# Patient Record
Sex: Female | Born: 1955 | Race: White | Hispanic: No | Marital: Married | Smoking: Never smoker
Health system: Southern US, Community
[De-identification: ages and names within clinical notes are randomized; demographics above are authoritative.]

## PROBLEM LIST (undated history)

## (undated) DIAGNOSIS — I10 Essential (primary) hypertension: Secondary | ICD-10-CM

## (undated) DIAGNOSIS — R519 Headache, unspecified: Secondary | ICD-10-CM

## (undated) DIAGNOSIS — M1990 Unspecified osteoarthritis, unspecified site: Secondary | ICD-10-CM

## (undated) DIAGNOSIS — R51 Headache: Secondary | ICD-10-CM

## (undated) DIAGNOSIS — F419 Anxiety disorder, unspecified: Secondary | ICD-10-CM

## (undated) DIAGNOSIS — R319 Hematuria, unspecified: Secondary | ICD-10-CM

## (undated) DIAGNOSIS — Z91038 Other insect allergy status: Secondary | ICD-10-CM

## (undated) DIAGNOSIS — N390 Urinary tract infection, site not specified: Secondary | ICD-10-CM

## (undated) DIAGNOSIS — Z87440 Personal history of urinary (tract) infections: Secondary | ICD-10-CM

## (undated) HISTORY — DX: Other insect allergy status: Z91.038

## (undated) HISTORY — PX: COLONOSCOPY: SHX5424

---

## 1999-01-24 ENCOUNTER — Other Ambulatory Visit: Admission: RE | Admit: 1999-01-24 | Discharge: 1999-01-24 | Payer: Self-pay | Admitting: *Deleted

## 2000-03-02 ENCOUNTER — Other Ambulatory Visit: Admission: RE | Admit: 2000-03-02 | Discharge: 2000-03-02 | Payer: Self-pay | Admitting: *Deleted

## 2015-10-08 ENCOUNTER — Ambulatory Visit (INDEPENDENT_AMBULATORY_CARE_PROVIDER_SITE_OTHER): Payer: BC Managed Care – PPO

## 2015-10-08 ENCOUNTER — Ambulatory Visit (INDEPENDENT_AMBULATORY_CARE_PROVIDER_SITE_OTHER): Payer: BC Managed Care – PPO | Admitting: Sports Medicine

## 2015-10-08 VITALS — BP 148/88 | HR 84 | Resp 18 | Wt 193.5 lb

## 2015-10-08 DIAGNOSIS — M170 Bilateral primary osteoarthritis of knee: Secondary | ICD-10-CM

## 2015-10-08 DIAGNOSIS — M1611 Unilateral primary osteoarthritis, right hip: Secondary | ICD-10-CM

## 2015-10-08 DIAGNOSIS — M25561 Pain in right knee: Secondary | ICD-10-CM

## 2015-10-08 DIAGNOSIS — M25562 Pain in left knee: Secondary | ICD-10-CM | POA: Diagnosis not present

## 2015-10-08 DIAGNOSIS — M25551 Pain in right hip: Secondary | ICD-10-CM | POA: Diagnosis not present

## 2015-10-08 MED ORDER — MELOXICAM 15 MG PO TABS: ORAL_TABLET | ORAL | Status: DC

## 2015-10-08 NOTE — Assessment & Plan Note (Signed)
X-rays, meloxicam, return if no better in 2 weeks for injection

## 2015-10-08 NOTE — Progress Notes (Signed)
   Subjective:    I'm seeing this patient as a consultation for:   Dr. Feliciana Rossetti  CC:  Right hip and bilateral knee pain  HPI: This is a pleasant 60 year old female with a long history of bilateral knee pain along the medial joint line and under the kneecap, worse when going up and down stairs, with gelling in the morning, has tried a bit of ibuprofen with minimal response. Symptoms are moderate, persistent without radiation, no mechanical symptoms.  She also has right groin and buttock pain, worse with weightbearing, also with gelling.  Past medical history, Surgical history, Family history not pertinant except as noted below, Social history, Allergies, and medications have been entered into the medical record, reviewed, and no changes needed.   Review of Systems: No headache, visual changes, nausea, vomiting, diarrhea, constipation, dizziness, abdominal pain, skin rash, fevers, chills, night sweats, weight loss, swollen lymph nodes, body aches, joint swelling, muscle aches, chest pain, shortness of breath, mood changes, visual or auditory hallucinations.   Objective:   General: Well Developed, well nourished, and in no acute distress.  Neuro/Psych: Alert and oriented x3, extra-ocular muscles intact, able to move all 4 extremities, sensation grossly intact. Skin: Warm and dry, no rashes noted.  Respiratory: Not using accessory muscles, speaking in full sentences, trachea midline.  Cardiovascular: Pulses palpable, no extremity edema. Abdomen: Does not appear distended. Bilateral knees: Normal to inspection with no erythema or effusion or obvious bony abnormalities. Tender to palpation along the patellar facets and the medial joint lines of both knees ROM normal in flexion and extension and lower leg rotation. Ligaments with solid consistent endpoints including ACL, PCL, LCL, MCL. Negative Mcmurray's and provocative meniscal tests. Non painful patellar compression. Patellar and  quadriceps tendons unremarkable. Hamstring and quadriceps strength is normal. Right Hip: ROM IR: 60 Deg, ER: 60 Deg, Flexion: 120 Deg, Extension: 100 Deg, Abduction: 45 Deg, Adduction: 45 Deg, there is reproduction of pain with internal rotation. Strength IR: 5/5, ER: 5/5, Flexion: 5/5, Extension: 5/5, Abduction: 5/5, Adduction: 5/5 Pelvic alignment unremarkable to inspection and palpation. Standing hip rotation and gait without trendelenburg / unsteadiness. Greater trochanter without tenderness to palpation. No tenderness over piriformis. No SI joint tenderness and normal minimal SI movement.  Impression and Recommendations:   This case required medical decision making of moderate complexity.

## 2015-10-08 NOTE — Assessment & Plan Note (Signed)
X-rays, meloxicam. 

## 2016-11-07 IMAGING — CR DG KNEE COMPLETE 4+V*R*
5 series · 5 of 5 positions shown · non-contrast
Comparison: None.

CLINICAL DATA: Pain for several months, no known acute injury,
initial encounter

EXAM:
RIGHT KNEE - COMPLETE 4+ VIEW

[knee ap]
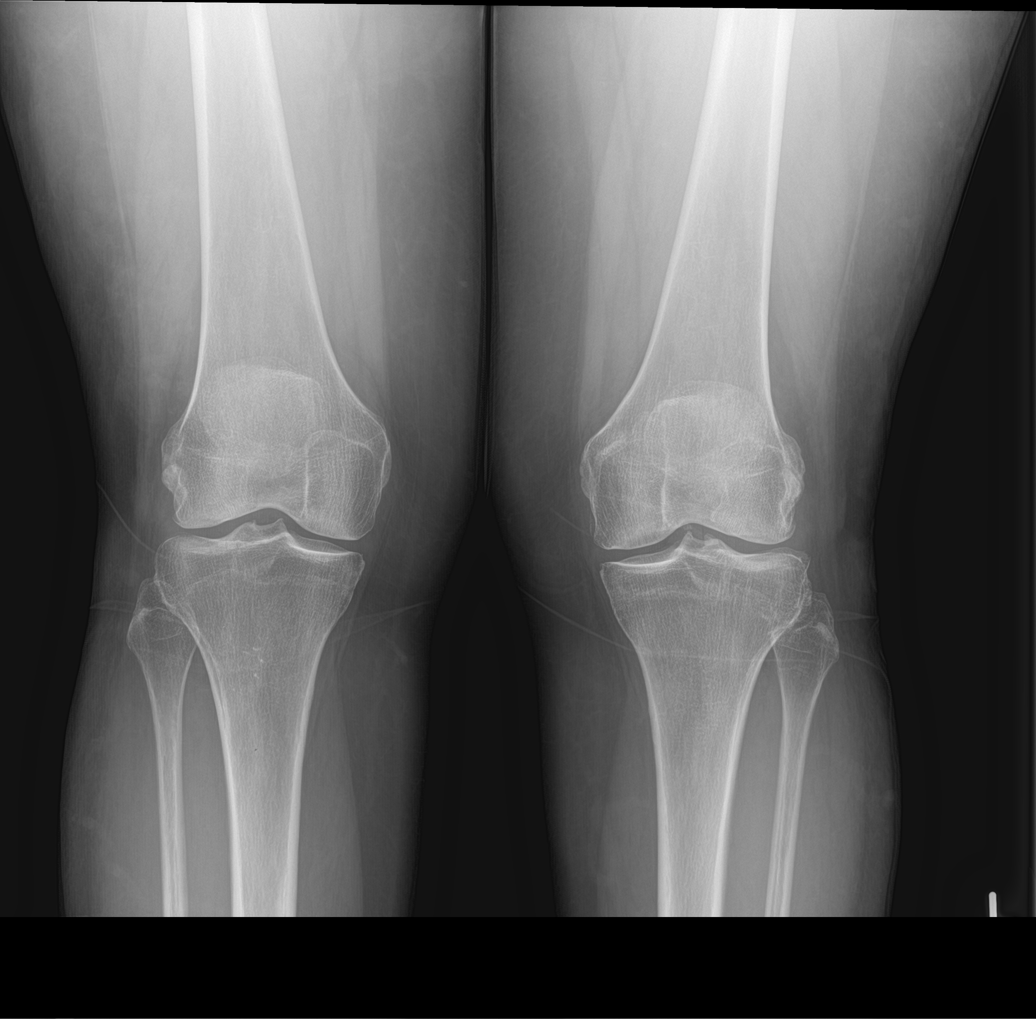

[tunnel]
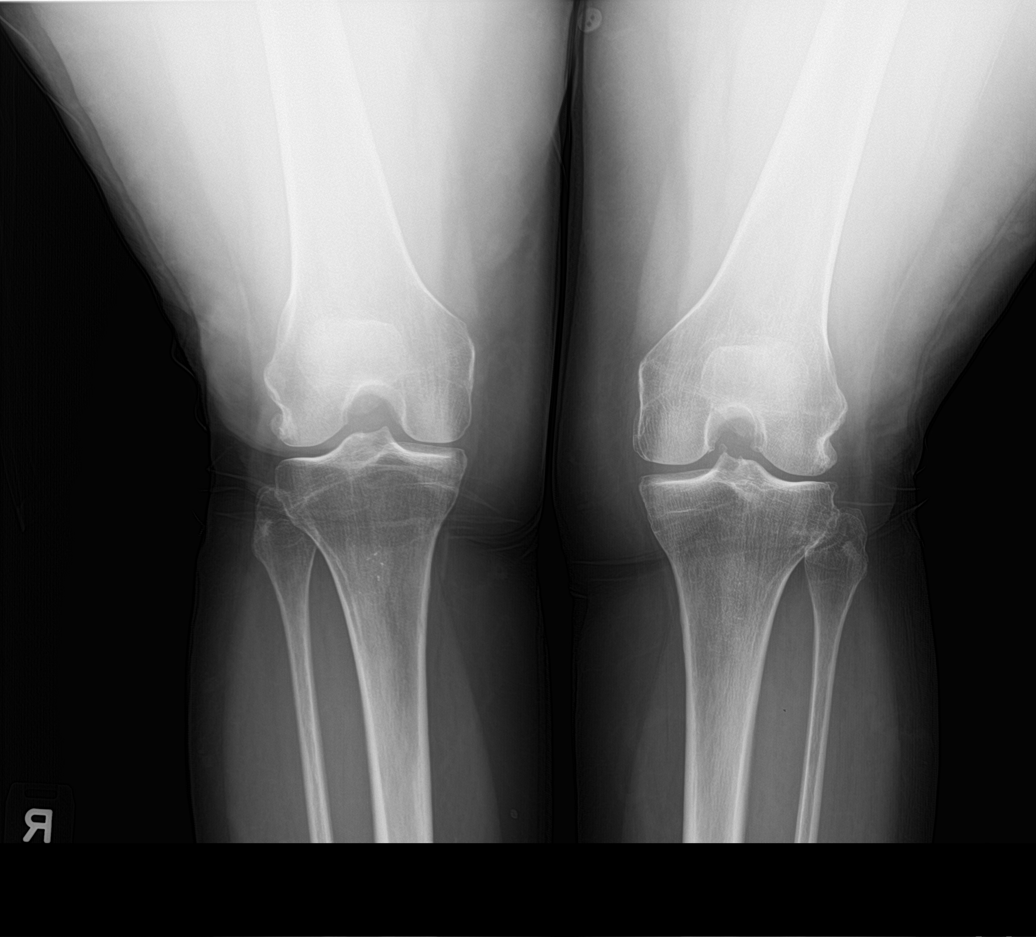

[knee lat (1 of 2)]
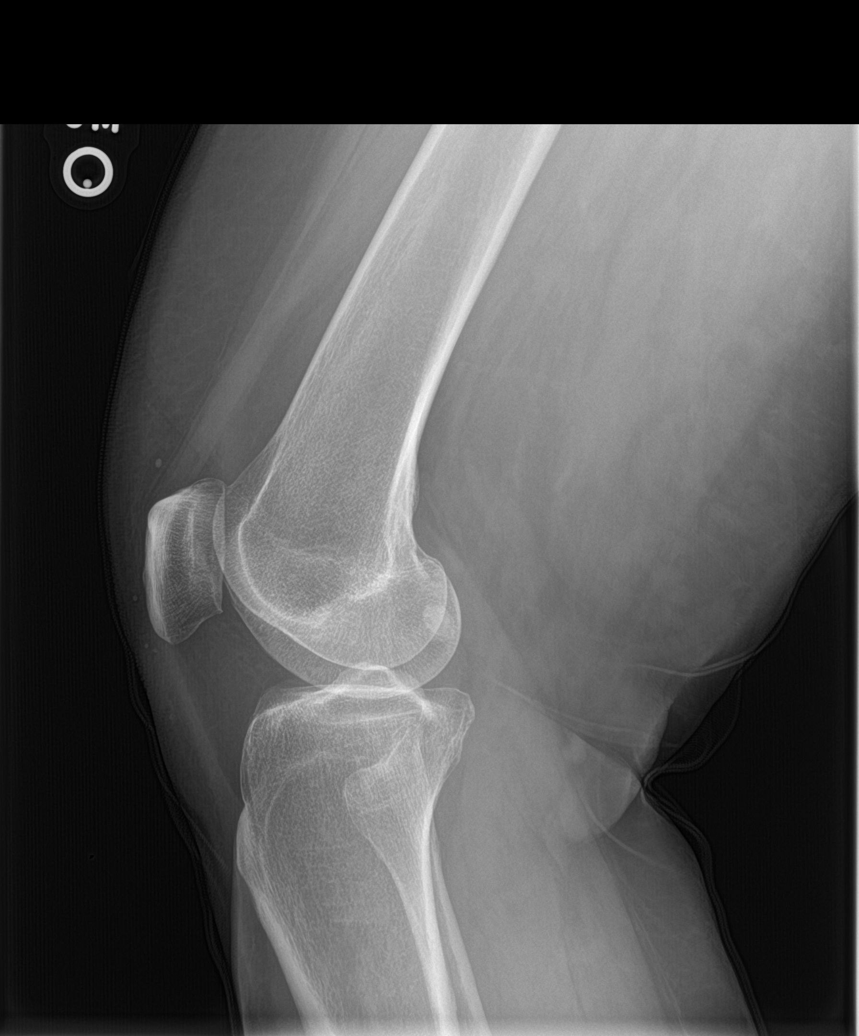

[knee sunrise]
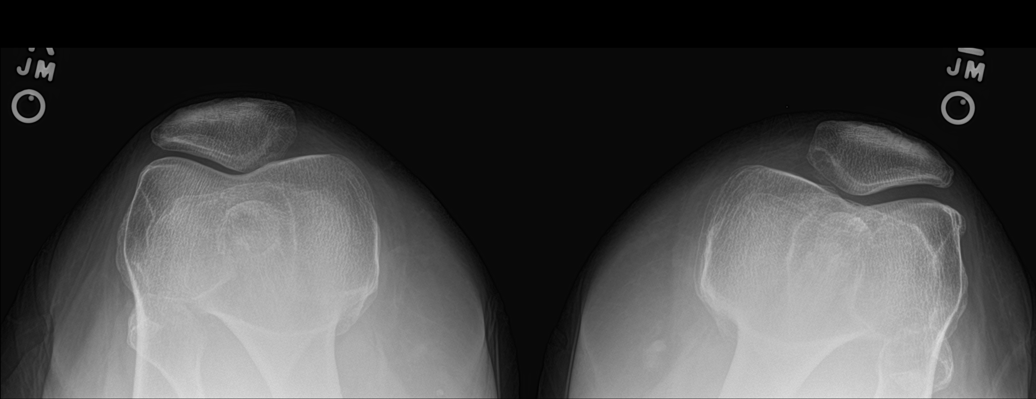

[knee lat (2 of 2)]
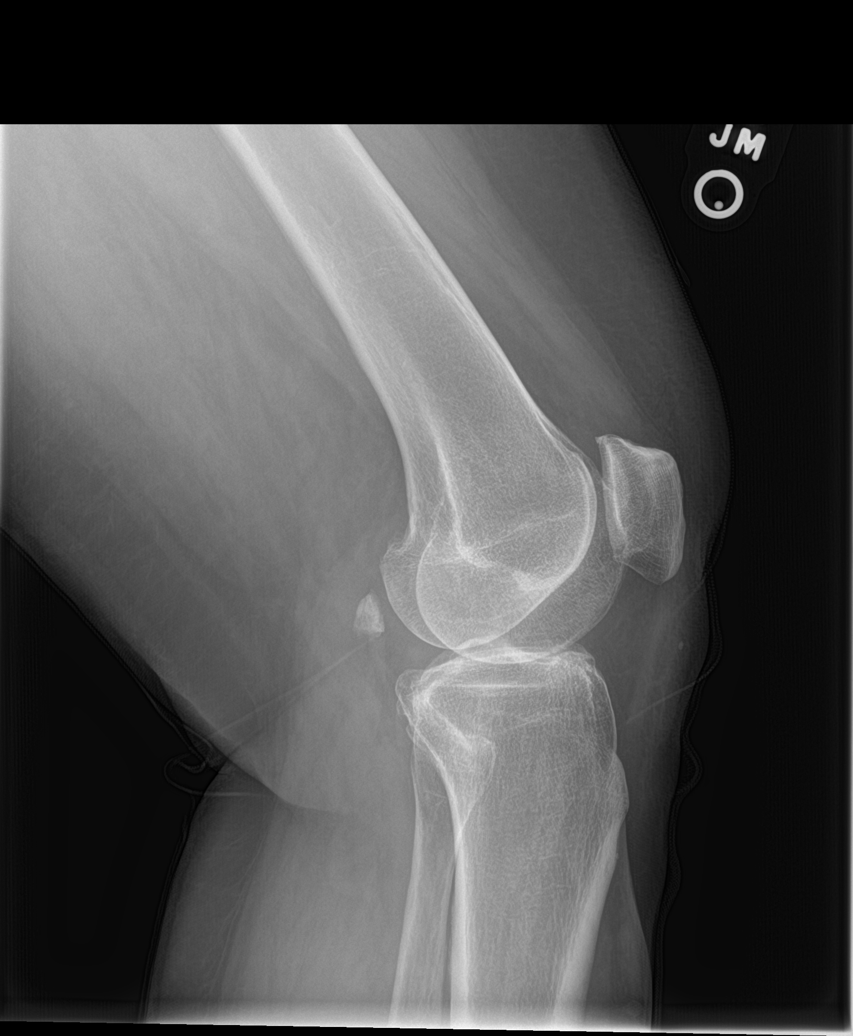

[5 of 5 positions shown; findings below may reference images not displayed]

FINDINGS: There is no evidence of fracture, dislocation, or joint effusion.
There is no evidence of arthropathy or other focal bone abnormality.
Soft tissues are unremarkable.
IMPRESSION: No acute abnormality noted.

## 2016-12-13 ENCOUNTER — Ambulatory Visit: Payer: Self-pay | Admitting: Orthopedic Surgery

## 2016-12-31 ENCOUNTER — Ambulatory Visit: Payer: Self-pay | Admitting: Orthopedic Surgery

## 2016-12-31 NOTE — H&P (Signed)
TOTAL HIP ADMISSION H&P  Patient is admitted for right total hip arthroplasty.  Subjective:  Chief Complaint: right hip pain  HPI: Gwendolyn Burnett, 61 y.o. female, has a history of pain and functional disability in the right hip(s) due to arthritis and patient has failed non-surgical conservative treatments for greater than 12 weeks to include NSAID's and/or analgesics, flexibility and strengthening excercises, use of assistive devices, weight reduction as appropriate and activity modification.  Onset of symptoms was gradual starting 5 years ago with rapidlly worsening course since that time.The patient noted no past surgery on the right hip(s).  Patient currently rates pain in the right hip at 10 out of 10 with activity. Patient has night pain, worsening of pain with activity and weight bearing, pain that interfers with activities of daily living, pain with passive range of motion and crepitus. Patient has evidence of subchondral cysts, subchondral sclerosis, periarticular osteophytes and joint space narrowing by imaging studies. This condition presents safety issues increasing the risk of falls. There is no current active infection.  Patient Active Problem List   Diagnosis Date Noted  . Primary osteoarthritis of right hip 10/08/2015  . Primary osteoarthritis of both knees 10/08/2015   No past medical history on file.  No past surgical history on file.   (Not in a hospital admission) Allergies  Allergen Reactions  . Sulfamethoxazole Itching    Sulfa Drugs    Social History  Substance Use Topics  . Smoking status: Not on file  . Smokeless tobacco: Not on file  . Alcohol use Not on file    No family history on file.   Review of Systems  Constitutional: Negative.   HENT: Negative.   Eyes: Negative.   Respiratory: Negative.   Cardiovascular: Negative.   Gastrointestinal: Negative.   Genitourinary: Negative.   Musculoskeletal: Positive for joint pain.  Skin: Negative.    Neurological: Negative.   Endo/Heme/Allergies: Negative.   Psychiatric/Behavioral: Negative.     Objective:  Physical Exam  Vitals reviewed. Constitutional: She is oriented to person, place, and time. She appears well-developed and well-nourished.  HENT:  Head: Normocephalic and atraumatic.  Eyes: Conjunctivae and EOM are normal. Pupils are equal, round, and reactive to light.  Neck: Normal range of motion. Neck supple.  Cardiovascular: Normal rate, regular rhythm and intact distal pulses.   Respiratory: Effort normal and breath sounds normal.  GI: Soft. She exhibits no distension.  Genitourinary:  Genitourinary Comments: deferred  Musculoskeletal:       Right hip: She exhibits decreased range of motion, decreased strength, tenderness and crepitus.  Neurological: She is alert and oriented to person, place, and time. She has normal reflexes.  Skin: Skin is warm and dry.  Psychiatric: She has a normal mood and affect. Her behavior is normal. Judgment and thought content normal.    Vital signs in last 24 hours: @VSRANGES@  Labs:   There is no height or weight on file to calculate BMI.   Imaging Review Plain radiographs demonstrate severe degenerative joint disease of the right hip(s). The bone quality appears to be adequate for age and reported activity level.  Assessment/Plan:  End stage arthritis, right hip(s)  The patient history, physical examination, clinical judgement of the provider and imaging studies are consistent with end stage degenerative joint disease of the right hip(s) and total hip arthroplasty is deemed medically necessary. The treatment options including medical management, injection therapy, arthroscopy and arthroplasty were discussed at length. The risks and benefits of total hip arthroplasty were   presented and reviewed. The risks due to aseptic loosening, infection, stiffness, dislocation/subluxation,  thromboembolic complications and other imponderables  were discussed.  The patient acknowledged the explanation, agreed to proceed with the plan and consent was signed. Patient is being admitted for inpatient treatment for surgery, pain control, PT, OT, prophylactic antibiotics, VTE prophylaxis, progressive ambulation and ADL's and discharge planning.The patient is planning to be discharged home with HEP. 

## 2017-01-15 ENCOUNTER — Encounter (HOSPITAL_COMMUNITY)
Admission: RE | Admit: 2017-01-15 | Discharge: 2017-01-15 | Disposition: A | Discharge status: Home / Self Care | Payer: BC Managed Care – PPO | Source: Ambulatory Visit | Attending: Orthopedic Surgery | Admitting: Orthopedic Surgery

## 2017-01-15 ENCOUNTER — Other Ambulatory Visit (HOSPITAL_COMMUNITY): Payer: Self-pay | Admitting: *Deleted

## 2017-01-15 ENCOUNTER — Encounter (HOSPITAL_COMMUNITY): Payer: Self-pay

## 2017-01-15 DIAGNOSIS — Z01812 Encounter for preprocedural laboratory examination: Secondary | ICD-10-CM | POA: Diagnosis present

## 2017-01-15 HISTORY — DX: Urinary tract infection, site not specified: N39.0

## 2017-01-15 HISTORY — DX: Unspecified osteoarthritis, unspecified site: M19.90

## 2017-01-15 HISTORY — DX: Anxiety disorder, unspecified: F41.9

## 2017-01-15 HISTORY — DX: Headache, unspecified: R51.9

## 2017-01-15 HISTORY — DX: Essential (primary) hypertension: I10

## 2017-01-15 HISTORY — DX: Headache: R51

## 2017-01-15 HISTORY — DX: Hematuria, unspecified: R31.9

## 2017-01-15 LAB — CBC
HCT: 41.3 % (ref 36.0–46.0)
Hemoglobin: 13.6 g/dL (ref 12.0–15.0)
MCH: 31.6 pg (ref 26.0–34.0)
MCHC: 32.9 g/dL (ref 30.0–36.0)
MCV: 95.8 fL (ref 78.0–100.0)
PLATELETS: 203 10*3/uL (ref 150–400)
RBC: 4.31 MIL/uL (ref 3.87–5.11)
RDW: 12.8 % (ref 11.5–15.5)
WBC: 5.9 10*3/uL (ref 4.0–10.5)

## 2017-01-15 LAB — TYPE AND SCREEN
ABO/RH(D): A POS
Antibody Screen: NEGATIVE

## 2017-01-15 LAB — BASIC METABOLIC PANEL
Anion gap: 7 (ref 5–15)
BUN: 13 mg/dL (ref 6–20)
CHLORIDE: 105 mmol/L (ref 101–111)
CO2: 29 mmol/L (ref 22–32)
CREATININE: 0.55 mg/dL (ref 0.44–1.00)
Calcium: 9.3 mg/dL (ref 8.9–10.3)
GFR calc non Af Amer: >60 mL/min (ref >60)
GLUCOSE: 98 mg/dL (ref 65–99)
Potassium: 3.4 mmol/L — ABNORMAL LOW (ref 3.5–5.1)
Sodium: 141 mmol/L (ref 135–145)

## 2017-01-15 LAB — SURGICAL PCR SCREEN
MRSA, PCR: NEGATIVE
Staphylococcus aureus: NEGATIVE

## 2017-01-15 LAB — ABO/RH: ABO/RH(D): A POS

## 2017-01-15 NOTE — Pre-Procedure Instructions (Signed)
Gwendolyn Burnett  01/15/2017      [facility] [place] [address] [place] [address] Phone: [phone] Fax: [phone]    Your procedure is scheduled on June 11  [facility] Ad[facility] at 0530 A.M.  Call this number if you have problems the morning of surgery:  [phone]   Remember:  Do not eat food or drink liquids after midnight.   Take these medicines the morning of surgery with A SIP OF WATER ALPRAZolam (XANAX), Naphazoline-Pheniramine (NAPHCON-A OP) if needed, SUMAtriptan (IMITREX), azelastine (ASTELIN) if needed  7 days prior to surgery STOP taking any meloxicam (MOBIC), Aspirin, Aleve, Naproxen, Ibuprofen, Motrin, Advil, Goody's, BC's, all herbal medications, fish oil, and all vitamins    Do not wear jewelry, make-up or nail polish.  Do not wear lotions, powders, or perfumes, or deoderant.  Do not shave 48 hours prior to surgery.    Do not bring valuables to the hospital.  [facility] is not responsible for any belongings or valuables.  Contacts, dentures or bridgework may not be worn into surgery.  Leave your suitcase in the car.  After surgery it may be brought to your room.  For patients admitted to the hospital, discharge time will be determined by your treatment team.  Patients discharged the day of surgery will not be allowed to drive home.    Special instructions:   [place]- Preparing For Surgery  Before surgery, you can play an important role. Because skin is not sterile, your skin needs to be as free of germs as possible. You can reduce the number of germs on your skin by washing with CHG (chlorahexidine gluconate) Soap before surgery.  CHG is an antiseptic cleaner which kills germs and bonds with the skin to continue killing germs even after washing.  Please do not use if you have an allergy to CHG or antibacterial soaps. If your skin becomes reddened/irritated stop using the CHG.  Do  not shave (including legs and underarms) for at least 48 hours prior to first CHG shower. It is OK to shave your face.  Please follow these instructions carefully.   1. Shower the NIGHT BEFORE SURGERY and the MORNING OF SURGERY with CHG.   2. If you chose to wash your hair, wash your hair first as usual with your normal shampoo.  3. After you shampoo, rinse your hair and body thoroughly to remove the shampoo.  4. Use CHG as you would any other liquid soap. You can apply CHG directly to the skin and wash gently with a scrungie or a clean washcloth.   5. Apply the CHG Soap to your body ONLY FROM THE NECK DOWN.  Do not use on open wounds or open sores. Avoid contact with your eyes, ears, mouth and genitals (private parts). Wash genitals (private parts) with your normal soap.  6. Wash thoroughly, paying special attention to the area where your surgery will be performed.  7. Thoroughly rinse your body with warm water from the neck down.  8. DO NOT shower/wash with your normal soap after using and rinsing off the CHG Soap.  9. Pat yourself dry with a CLEAN TOWEL.   10. Wear CLEAN PAJAMAS   11. Place CLEAN SHEETS on your bed the night of your first shower and DO NOT SLEEP WITH PETS.    Day of Surgery: Do not apply any deodorants/lotions. Please wear clean clothes to the  hospital/surgery center.      Please read over the following fact sheets that you were given.

## 2017-01-15 NOTE — Progress Notes (Signed)
PCP - Feliciana RossettiGreg Grisso Cardiologist - denies  Chest x-ray - not needed EKG - 12/09/16 requesting Stress Test - denies ECHO - denies Cardiac Cath - denies  Sending to anesthesia for review of requested EKG    Patient denies shortness of breath, fever, cough and chest pain at PAT appointment   Patient verbalized understanding of instructions that were given to them at the PAT appointment. Patient was also instructed that they will need to review over the PAT instructions again at home before surgery.

## 2017-01-22 NOTE — Progress Notes (Signed)
Called [phone] requested EKG tracing spoke with office staff states they will fax it over.

## 2017-01-23 MED ORDER — SODIUM CHLORIDE 0.9 % IV SOLN: INTRAVENOUS | Status: DC

## 2017-01-23 MED ORDER — ACETAMINOPHEN 10 MG/ML IV SOLN
1000.0000 mg | INTRAVENOUS | Status: AC
  Administered 2017-01-26: 1000 mg via INTRAVENOUS
  Filled 2017-01-23: qty 100

## 2017-01-23 MED ORDER — CEFAZOLIN SODIUM-DEXTROSE 2-4 GM/100ML-% IV SOLN
2.0000 g | INTRAVENOUS | Status: AC
Start: 2017-01-26 — End: 2017-01-26
  Administered 2017-01-26: 2 g via INTRAVENOUS
  Filled 2017-01-23: qty 100

## 2017-01-23 MED ORDER — TRANEXAMIC ACID 1000 MG/10ML IV SOLN
1000.0000 mg | INTRAVENOUS | Status: AC
  Administered 2017-01-26: 1000 mg via INTRAVENOUS
  Filled 2017-01-23: qty 10

## 2017-01-26 ENCOUNTER — Inpatient Hospital Stay (HOSPITAL_COMMUNITY)
Admission: RE | Admit: 2017-01-26 | Discharge: 2017-01-27 | DRG: 470 | Disposition: A | Discharge status: Home / Self Care | Payer: BC Managed Care – PPO | Source: Ambulatory Visit | Attending: Orthopedic Surgery | Admitting: Orthopedic Surgery

## 2017-01-26 ENCOUNTER — Encounter (HOSPITAL_COMMUNITY)
Admission: RE | Disposition: A | Discharge status: Home / Self Care | Payer: Self-pay | Source: Ambulatory Visit | Attending: Orthopedic Surgery

## 2017-01-26 ENCOUNTER — Inpatient Hospital Stay (HOSPITAL_COMMUNITY): Payer: BC Managed Care – PPO | Admitting: Emergency Medicine

## 2017-01-26 ENCOUNTER — Inpatient Hospital Stay (HOSPITAL_COMMUNITY): Payer: BC Managed Care – PPO

## 2017-01-26 ENCOUNTER — Inpatient Hospital Stay (HOSPITAL_COMMUNITY): Payer: BC Managed Care – PPO | Admitting: Anesthesiology

## 2017-01-26 ENCOUNTER — Encounter (HOSPITAL_COMMUNITY): Payer: Self-pay | Admitting: General Practice

## 2017-01-26 DIAGNOSIS — Z419 Encounter for procedure for purposes other than remedying health state, unspecified: Secondary | ICD-10-CM

## 2017-01-26 DIAGNOSIS — Z79899 Other long term (current) drug therapy: Secondary | ICD-10-CM

## 2017-01-26 DIAGNOSIS — I10 Essential (primary) hypertension: Secondary | ICD-10-CM | POA: Diagnosis present

## 2017-01-26 DIAGNOSIS — M25551 Pain in right hip: Secondary | ICD-10-CM | POA: Diagnosis present

## 2017-01-26 DIAGNOSIS — M1611 Unilateral primary osteoarthritis, right hip: Secondary | ICD-10-CM | POA: Diagnosis present

## 2017-01-26 DIAGNOSIS — Z09 Encounter for follow-up examination after completed treatment for conditions other than malignant neoplasm: Secondary | ICD-10-CM

## 2017-01-26 DIAGNOSIS — F419 Anxiety disorder, unspecified: Secondary | ICD-10-CM | POA: Diagnosis present

## 2017-01-26 HISTORY — PX: TOTAL HIP ARTHROPLASTY: SHX124

## 2017-01-26 HISTORY — DX: Personal history of urinary (tract) infections: Z87.440

## 2017-01-26 SURGERY — ARTHROPLASTY, HIP, TOTAL, ANTERIOR APPROACH
Anesthesia: General | Site: Hip | Laterality: Right

## 2017-01-26 MED ORDER — MIDAZOLAM HCL 5 MG/5ML IJ SOLN
INTRAMUSCULAR | Status: DC | PRN
  Administered 2017-01-26 (×2): 2 mg via INTRAVENOUS

## 2017-01-26 MED ORDER — TRANEXAMIC ACID 1000 MG/10ML IV SOLN
1000.0000 mg | Freq: Once | INTRAVENOUS | Status: AC
  Administered 2017-01-26: 1000 mg via INTRAVENOUS
  Filled 2017-01-26: qty 10

## 2017-01-26 MED ORDER — IRBESARTAN 150 MG PO TABS
75.0000 mg | ORAL_TABLET | Freq: Every day | ORAL | Status: DC
  Administered 2017-01-26 – 2017-01-27 (×2): 75 mg via ORAL
  Filled 2017-01-26 (×2): qty 1

## 2017-01-26 MED ORDER — METHOCARBAMOL 1000 MG/10ML IJ SOLN: 500.0000 mg | Freq: Four times a day (QID) | INTRAVENOUS | Status: DC | PRN

## 2017-01-26 MED ORDER — CEFAZOLIN SODIUM-DEXTROSE 2-4 GM/100ML-% IV SOLN
2.0000 g | Freq: Four times a day (QID) | INTRAVENOUS | Status: AC
  Administered 2017-01-26 (×2): 2 g via INTRAVENOUS
  Filled 2017-01-26 (×2): qty 100

## 2017-01-26 MED ORDER — ONDANSETRON HCL 4 MG/2ML IJ SOLN: 4.0000 mg | Freq: Four times a day (QID) | INTRAMUSCULAR | Status: DC | PRN

## 2017-01-26 MED ORDER — PHENYLEPHRINE 40 MCG/ML (10ML) SYRINGE FOR IV PUSH (FOR BLOOD PRESSURE SUPPORT)
PREFILLED_SYRINGE | INTRAVENOUS | Status: DC | PRN
Start: 2017-01-26 — End: 2017-01-26
  Administered 2017-01-26 (×2): 80 ug via INTRAVENOUS

## 2017-01-26 MED ORDER — PHENYLEPHRINE 40 MCG/ML (10ML) SYRINGE FOR IV PUSH (FOR BLOOD PRESSURE SUPPORT)
PREFILLED_SYRINGE | INTRAVENOUS | Status: AC
  Filled 2017-01-26: qty 10

## 2017-01-26 MED ORDER — FENTANYL CITRATE (PF) 250 MCG/5ML IJ SOLN
INTRAMUSCULAR | Status: DC | PRN
  Administered 2017-01-26 (×2): 50 ug via INTRAVENOUS

## 2017-01-26 MED ORDER — PHENYLEPHRINE HCL 10 MG/ML IJ SOLN
INTRAMUSCULAR | Status: DC | PRN
  Administered 2017-01-26: 25 ug/min via INTRAVENOUS

## 2017-01-26 MED ORDER — PROPOFOL 10 MG/ML IV BOLUS
INTRAVENOUS | Status: DC | PRN
  Administered 2017-01-26: 30 mg via INTRAVENOUS

## 2017-01-26 MED ORDER — BUPIVACAINE HCL (PF) 0.75 % IJ SOLN
INTRAMUSCULAR | Status: DC | PRN
  Administered 2017-01-26: 1.8 mL via INTRATHECAL

## 2017-01-26 MED ORDER — PROPOFOL 10 MG/ML IV BOLUS
INTRAVENOUS | Status: AC
  Filled 2017-01-26: qty 20

## 2017-01-26 MED ORDER — BUPIVACAINE-EPINEPHRINE (PF) 0.5% -1:200000 IJ SOLN
INTRAMUSCULAR | Status: AC
  Filled 2017-01-26: qty 30

## 2017-01-26 MED ORDER — HYDROMORPHONE HCL 1 MG/ML IJ SOLN: 0.2500 mg | INTRAMUSCULAR | Status: DC | PRN

## 2017-01-26 MED ORDER — CHLORHEXIDINE GLUCONATE 4 % EX LIQD: 60.0000 mL | Freq: Once | CUTANEOUS | Status: DC

## 2017-01-26 MED ORDER — PROPOFOL 500 MG/50ML IV EMUL
INTRAVENOUS | Status: DC | PRN
  Administered 2017-01-26: 75 ug/kg/min via INTRAVENOUS

## 2017-01-26 MED ORDER — VITAMIN C 500 MG PO TABS
1000.0000 mg | ORAL_TABLET | Freq: Every day | ORAL | Status: DC
  Administered 2017-01-27: 1000 mg via ORAL
  Filled 2017-01-26: qty 2

## 2017-01-26 MED ORDER — ACETAMINOPHEN 325 MG PO TABS: 650.0000 mg | ORAL_TABLET | Freq: Four times a day (QID) | ORAL | Status: DC | PRN

## 2017-01-26 MED ORDER — FENTANYL CITRATE (PF) 250 MCG/5ML IJ SOLN
INTRAMUSCULAR | Status: AC
  Filled 2017-01-26: qty 5

## 2017-01-26 MED ORDER — ASPIRIN 81 MG PO CHEW
81.0000 mg | CHEWABLE_TABLET | Freq: Two times a day (BID) | ORAL | Status: DC
  Administered 2017-01-26 – 2017-01-27 (×2): 81 mg via ORAL
  Filled 2017-01-26 (×2): qty 1

## 2017-01-26 MED ORDER — KETOROLAC TROMETHAMINE 15 MG/ML IJ SOLN
15.0000 mg | Freq: Four times a day (QID) | INTRAMUSCULAR | Status: AC
  Administered 2017-01-26 – 2017-01-27 (×4): 15 mg via INTRAVENOUS
  Filled 2017-01-26 (×4): qty 1

## 2017-01-26 MED ORDER — MIDAZOLAM HCL 2 MG/2ML IJ SOLN
INTRAMUSCULAR | Status: AC
  Filled 2017-01-26: qty 2

## 2017-01-26 MED ORDER — 0.9 % SODIUM CHLORIDE (POUR BTL) OPTIME
TOPICAL | Status: DC | PRN
  Administered 2017-01-26: 1000 mL

## 2017-01-26 MED ORDER — LORATADINE 10 MG PO TABS
10.0000 mg | ORAL_TABLET | Freq: Every day | ORAL | Status: DC
  Filled 2017-01-26: qty 1

## 2017-01-26 MED ORDER — MENTHOL 3 MG MT LOZG: 1.0000 | LOZENGE | OROMUCOSAL | Status: DC | PRN

## 2017-01-26 MED ORDER — POLYETHYLENE GLYCOL 3350 17 G PO PACK: 17.0000 g | PACK | Freq: Every day | ORAL | Status: DC | PRN

## 2017-01-26 MED ORDER — DOCUSATE SODIUM 100 MG PO CAPS
100.0000 mg | ORAL_CAPSULE | Freq: Two times a day (BID) | ORAL | Status: DC
  Administered 2017-01-26 – 2017-01-27 (×2): 100 mg via ORAL
  Filled 2017-01-26 (×2): qty 1

## 2017-01-26 MED ORDER — SODIUM CHLORIDE 0.9 % IV SOLN
INTRAVENOUS | Status: DC
  Administered 2017-01-27: 02:00:00 via INTRAVENOUS

## 2017-01-26 MED ORDER — KETOROLAC TROMETHAMINE 30 MG/ML IJ SOLN
INTRAMUSCULAR | Status: AC
  Filled 2017-01-26: qty 1

## 2017-01-26 MED ORDER — LACTATED RINGERS IV SOLN
INTRAVENOUS | Status: DC | PRN
  Administered 2017-01-26 (×3): via INTRAVENOUS

## 2017-01-26 MED ORDER — ONDANSETRON HCL 4 MG/2ML IJ SOLN
INTRAMUSCULAR | Status: DC | PRN
  Administered 2017-01-26: 4 mg via INTRAVENOUS

## 2017-01-26 MED ORDER — SODIUM CHLORIDE 0.9 % IR SOLN
Status: DC | PRN
  Administered 2017-01-26: 3000 mL

## 2017-01-26 MED ORDER — LIDOCAINE 2% (20 MG/ML) 5 ML SYRINGE
INTRAMUSCULAR | Status: AC
  Filled 2017-01-26: qty 5

## 2017-01-26 MED ORDER — ALPRAZOLAM 0.25 MG PO TABS: 0.2500 mg | ORAL_TABLET | Freq: Two times a day (BID) | ORAL | Status: DC | PRN

## 2017-01-26 MED ORDER — ONDANSETRON HCL 4 MG PO TABS: 4.0000 mg | ORAL_TABLET | Freq: Four times a day (QID) | ORAL | Status: DC | PRN

## 2017-01-26 MED ORDER — HYDROMORPHONE HCL 1 MG/ML IJ SOLN: 0.5000 mg | INTRAMUSCULAR | Status: DC | PRN

## 2017-01-26 MED ORDER — DIPHENHYDRAMINE HCL 12.5 MG/5ML PO ELIX: 12.5000 mg | ORAL_SOLUTION | ORAL | Status: DC | PRN

## 2017-01-26 MED ORDER — NAPHAZOLINE-PHENIRAMINE 0.025-0.3 % OP SOLN: 1.0000 [drp] | Freq: Two times a day (BID) | OPHTHALMIC | Status: DC | PRN

## 2017-01-26 MED ORDER — DEXAMETHASONE SODIUM PHOSPHATE 10 MG/ML IJ SOLN
10.0000 mg | Freq: Once | INTRAMUSCULAR | Status: AC
  Administered 2017-01-27: 10 mg via INTRAVENOUS
  Filled 2017-01-26: qty 1

## 2017-01-26 MED ORDER — PROMETHAZINE HCL 25 MG/ML IJ SOLN
6.2500 mg | INTRAMUSCULAR | Status: DC | PRN
Start: 2017-01-26 — End: 2017-01-26

## 2017-01-26 MED ORDER — KETOROLAC TROMETHAMINE 30 MG/ML IJ SOLN
INTRAMUSCULAR | Status: DC | PRN
  Administered 2017-01-26: 30 mg

## 2017-01-26 MED ORDER — METOCLOPRAMIDE HCL 5 MG/ML IJ SOLN: 5.0000 mg | Freq: Three times a day (TID) | INTRAMUSCULAR | Status: DC | PRN

## 2017-01-26 MED ORDER — ONDANSETRON HCL 4 MG/2ML IJ SOLN
INTRAMUSCULAR | Status: AC
  Filled 2017-01-26: qty 2

## 2017-01-26 MED ORDER — ROCURONIUM BROMIDE 10 MG/ML (PF) SYRINGE
PREFILLED_SYRINGE | INTRAVENOUS | Status: AC
  Filled 2017-01-26: qty 5

## 2017-01-26 MED ORDER — SENNA 8.6 MG PO TABS: 2.0000 | ORAL_TABLET | Freq: Every day | ORAL | Status: DC

## 2017-01-26 MED ORDER — ALUM & MAG HYDROXIDE-SIMETH 200-200-20 MG/5ML PO SUSP
30.0000 mL | ORAL | Status: DC | PRN
  Administered 2017-01-26: 30 mL via ORAL
  Filled 2017-01-26: qty 30

## 2017-01-26 MED ORDER — METHOCARBAMOL 500 MG PO TABS: 500.0000 mg | ORAL_TABLET | Freq: Four times a day (QID) | ORAL | Status: DC | PRN

## 2017-01-26 MED ORDER — SODIUM CHLORIDE 0.9 % IJ SOLN
INTRAMUSCULAR | Status: DC | PRN
  Administered 2017-01-26: 30 mL

## 2017-01-26 MED ORDER — ACETAMINOPHEN 650 MG RE SUPP: 650.0000 mg | Freq: Four times a day (QID) | RECTAL | Status: DC | PRN

## 2017-01-26 MED ORDER — METOCLOPRAMIDE HCL 5 MG PO TABS: 5.0000 mg | ORAL_TABLET | Freq: Three times a day (TID) | ORAL | Status: DC | PRN

## 2017-01-26 MED ORDER — POVIDONE-IODINE 10 % EX SWAB: 2.0000 "application " | Freq: Once | CUTANEOUS | Status: DC

## 2017-01-26 MED ORDER — PHENOL 1.4 % MT LIQD: 1.0000 | OROMUCOSAL | Status: DC | PRN

## 2017-01-26 MED ORDER — PROPOFOL 1000 MG/100ML IV EMUL
INTRAVENOUS | Status: AC
  Filled 2017-01-26: qty 300

## 2017-01-26 MED ORDER — HYDROCODONE-ACETAMINOPHEN 5-325 MG PO TABS: 1.0000 | ORAL_TABLET | ORAL | Status: DC | PRN

## 2017-01-26 SURGICAL SUPPLY — 55 items
ADH SKN CLS APL DERMABOND .7 (GAUZE/BANDAGES/DRESSINGS) ×2
ADH SKN CLS LQ APL DERMABOND (GAUZE/BANDAGES/DRESSINGS) ×1
ALCOHOL ISOPROPYL (RUBBING) (MISCELLANEOUS) ×3 IMPLANT
BLADE CLIPPER SURG (BLADE) IMPLANT
CAPT HIP TOTAL 2 ×3 IMPLANT
CHLORAPREP W/TINT 26ML (MISCELLANEOUS) ×3 IMPLANT
COVER SURGICAL LIGHT HANDLE (MISCELLANEOUS) ×3 IMPLANT
DERMABOND ADHESIVE PROPEN (GAUZE/BANDAGES/DRESSINGS) ×2
DERMABOND ADVANCED (GAUZE/BANDAGES/DRESSINGS) ×4
DERMABOND ADVANCED .7 DNX12 (GAUZE/BANDAGES/DRESSINGS) ×2 IMPLANT
DERMABOND ADVANCED .7 DNX6 (GAUZE/BANDAGES/DRESSINGS) ×1 IMPLANT
DRAPE C-ARM 42X72 X-RAY (DRAPES) ×3 IMPLANT
DRAPE STERI IOBAN 125X83 (DRAPES) ×3 IMPLANT
DRAPE U-SHAPE 47X51 STRL (DRAPES) ×9 IMPLANT
DRSG AQUACEL AG ADV 3.5X10 (GAUZE/BANDAGES/DRESSINGS) ×3 IMPLANT
ELECT BLADE 4.0 EZ CLEAN MEGAD (MISCELLANEOUS) ×3
ELECT REM PT RETURN 9FT ADLT (ELECTROSURGICAL) ×3
ELECTRODE BLDE 4.0 EZ CLN MEGD (MISCELLANEOUS) ×1 IMPLANT
ELECTRODE REM PT RTRN 9FT ADLT (ELECTROSURGICAL) ×1 IMPLANT
EVACUATOR 1/8 PVC DRAIN (DRAIN) IMPLANT
GLOVE BIO SURGEON STRL SZ8.5 (GLOVE) ×6 IMPLANT
GLOVE BIOGEL PI IND STRL 8.5 (GLOVE) ×1 IMPLANT
GLOVE BIOGEL PI INDICATOR 8.5 (GLOVE) ×2
GOWN STRL REUS W/ TWL LRG LVL3 (GOWN DISPOSABLE) ×2 IMPLANT
GOWN STRL REUS W/TWL 2XL LVL3 (GOWN DISPOSABLE) ×3 IMPLANT
GOWN STRL REUS W/TWL LRG LVL3 (GOWN DISPOSABLE) ×6
HANDPIECE INTERPULSE COAX TIP (DISPOSABLE) ×3
HOOD PEEL AWAY FACE SHEILD DIS (HOOD) ×6 IMPLANT
KIT BASIN OR (CUSTOM PROCEDURE TRAY) ×3 IMPLANT
KIT ROOM TURNOVER OR (KITS) ×3 IMPLANT
MANIFOLD NEPTUNE II (INSTRUMENTS) ×3 IMPLANT
MARKER SKIN DUAL TIP RULER LAB (MISCELLANEOUS) ×6 IMPLANT
NEEDLE SPNL 18GX3.5 QUINCKE PK (NEEDLE) ×3 IMPLANT
NS IRRIG 1000ML POUR BTL (IV SOLUTION) ×3 IMPLANT
PACK TOTAL JOINT (CUSTOM PROCEDURE TRAY) ×3 IMPLANT
PACK UNIVERSAL I (CUSTOM PROCEDURE TRAY) ×3 IMPLANT
PAD ARMBOARD 7.5X6 YLW CONV (MISCELLANEOUS) ×6 IMPLANT
SAW OSC TIP CART 19.5X105X1.3 (SAW) ×3 IMPLANT
SEALER BIPOLAR AQUA 6.0 (INSTRUMENTS) IMPLANT
SET HNDPC FAN SPRY TIP SCT (DISPOSABLE) ×1 IMPLANT
SOL PREP POV-IOD 4OZ 10% (MISCELLANEOUS) ×3 IMPLANT
SUT ETHIBOND NAB CT1 #1 30IN (SUTURE) ×6 IMPLANT
SUT MNCRL AB 3-0 PS2 18 (SUTURE) ×3 IMPLANT
SUT MON AB 2-0 CT1 36 (SUTURE) ×3 IMPLANT
SUT VIC AB 1 CT1 27 (SUTURE) ×2
SUT VIC AB 1 CT1 27XBRD ANBCTR (SUTURE) ×1 IMPLANT
SUT VIC AB 2-0 CT1 27 (SUTURE) ×3
SUT VIC AB 2-0 CT1 TAPERPNT 27 (SUTURE) ×1 IMPLANT
SUT VLOC 180 0 24IN GS25 (SUTURE) ×3 IMPLANT
SYR 50ML LL SCALE MARK (SYRINGE) ×3 IMPLANT
TOWEL OR 17X24 6PK STRL BLUE (TOWEL DISPOSABLE) ×3 IMPLANT
TOWEL OR 17X26 10 PK STRL BLUE (TOWEL DISPOSABLE) ×3 IMPLANT
TRAY CATH 16FR W/PLASTIC CATH (SET/KITS/TRAYS/PACK) IMPLANT
TRAY FOLEY CATH SILVER 16FR (SET/KITS/TRAYS/PACK) IMPLANT
WATER STERILE IRR 1000ML POUR (IV SOLUTION) ×9 IMPLANT

## 2017-01-26 NOTE — Evaluation (Signed)
Physical Therapy Evaluation Patient Details Name: Gwendolyn Burnett MRN: [id number] DOB: Aug 08, 1956 Today's Date: 01/26/2017   History of Present Illness  Pt is a 61 y/o female s/p elective R THA; direct anterior approproache. PMH includes HTN.   Clinical Impression  Pt s/p surgery above with deficits below. PTA, pt was using cane for ambulation secondary to pain in R hip. Upon evaluation, pt limited by post op pain and weakness, along with mild unsteadiness. Pt requiring min guard to min A for functional mobility tasks. Pt reports husband will be available to assist upon d/c and has all necessary DME at home. Follow up therapy per MD arrangements. Will continue to follow acutely to progress mobility.     Follow Up Recommendations DC plan and follow up therapy as arranged by surgeon;Supervision/Assistance - 24 hour    Equipment Recommendations  None recommended by PT    Recommendations for Other Services       Precautions / Restrictions Precautions Precautions: None Precaution Comments: Reviewed supine ther ex on THA handout.  Restrictions Weight Bearing Restrictions: Yes RLE Weight Bearing: Weight bearing as tolerated      Mobility  Bed Mobility Overal bed mobility: Needs Assistance Bed Mobility: Supine to Sit;Sit to Supine     Supine to sit: Supervision;HOB elevated Sit to supine: Min assist   General bed mobility comments: Supervision for supine>sit and min A for RLE management for return to supine.   Transfers Overall transfer level: Needs assistance Equipment used: Rolling walker (2 wheeled) Transfers: Sit to/from Stand Sit to Stand: Min guard         General transfer comment: Min guard for safety.   Ambulation/Gait Ambulation/Gait assistance: Min guard Ambulation Distance (Feet): 75 Feet Assistive device: Rolling walker (2 wheeled) Gait Pattern/deviations: Step-to pattern;Decreased step length - right;Decreased weight shift to right;Antalgic Gait velocity:  Decreased Gait velocity interpretation: Below normal speed for age/gender General Gait Details: Slow, antalgic gait secondary to post op pain and weakness. No LOB noted. REquired VCs for sequencing.   Stairs            Wheelchair Mobility    Modified Rankin (Stroke Patients Only)       Balance Overall balance assessment: Needs assistance Sitting-balance support: No upper extremity supported;Feet supported Sitting balance-Leahy Scale: Good     Standing balance support: Bilateral upper extremity supported;During functional activity Standing balance-Leahy Scale: Poor Standing balance comment: Reliant on RW for stability                              Pertinent Vitals/Pain Pain Assessment: 0-10 Pain Score: 5  Pain Location: R hip  Pain Descriptors / Indicators: Aching;Sore;Operative site guarding Pain Intervention(s): Limited activity within patient's tolerance;Monitored during session;Repositioned    Home Living Family/patient expects to be discharged to:: Private residence Living Arrangements: Spouse/significant other Available Help at Discharge: Family;Available 24 hours/day Type of Home: House Home Access: Stairs to enter Entrance Stairs-Rails: None Entrance Stairs-Number of Steps: 2 Home Layout: Two level;Able to live on main level with bedroom/bathroom Home Equipment: Bedside commode;Walker - 2 wheels;Shower seat;Cane - single point      Prior Function Level of Independence: Independent with assistive device(s)         Comments: used cane at baseline secondary to pain      Hand Dominance   Dominant Hand: Right    Extremity/Trunk Assessment   Upper Extremity Assessment Upper Extremity Assessment: Overall WFL for tasks assessed  Lower Extremity Assessment Lower Extremity Assessment: RLE deficits/detail RLE Deficits / Details: Sensory in tact. Deficits consistent with post op pain and weakness. Able to perform exercises below.      Cervical / Trunk Assessment Cervical / Trunk Assessment: Normal  Communication   Communication: No difficulties  Cognition Arousal/Alertness: Awake/alert Behavior During Therapy: WFL for tasks assessed/performed Overall Cognitive Status: Within Functional Limits for tasks assessed                                        General Comments General comments (skin integrity, edema, etc.): Husband present throughout session.     Exercises Total Joint Exercises Ankle Circles/Pumps: AROM;Both;10 reps;Supine Quad Sets: AROM;Right;10 reps;Supine Short Arc Quad: AROM;Right;10 reps;Supine Heel Slides: AROM;Right;10 reps;Supine Hip ABduction/ADduction: AROM;Right;10 reps;Supine   Assessment/Plan    PT Assessment Patient needs continued PT services  PT Problem List Decreased strength;Decreased range of motion;Decreased balance;Decreased mobility;Decreased knowledge of use of DME;Decreased knowledge of precautions;Pain       PT Treatment Interventions DME instruction;Gait training;Stair training;Functional mobility training;Therapeutic activities;Therapeutic exercise;Balance training;Neuromuscular re-education;Patient/family education    PT Goals (Current goals can be found in the Care Plan section)  Acute Rehab PT Goals Patient Stated Goal: to go home  PT Goal Formulation: With patient Time For Goal Achievement: 02/02/17 Potential to Achieve Goals: Good    Frequency 7X/week   Barriers to discharge        Co-evaluation               AM-PAC PT "6 Clicks" Daily Activity  Outcome Measure Difficulty turning over in bed (including adjusting bedclothes, sheets and blankets)?: A Little Difficulty moving from lying on back to sitting on the side of the bed? : Total Difficulty sitting down on and standing up from a chair with arms (e.g., wheelchair, bedside commode, etc,.)?: Total Help needed moving to and from a bed to chair (including a wheelchair)?: A  Little Help needed walking in hospital room?: A Little Help needed climbing 3-5 steps with a railing? : A Little 6 Click Score: 14    End of Session Equipment Utilized During Treatment: Gait belt Activity Tolerance: Patient tolerated treatment well Patient left: in bed;with call bell/phone within reach;with family/visitor present Nurse Communication: Mobility status;Other (comment) (need for recliner) PT Visit Diagnosis: Other abnormalities of gait and mobility (R26.89);Pain Pain - Right/Left: Right Pain - part of body: Hip    Time: [id number] PT Time Calculation (min) (ACUTE ONLY): 33 min   Charges:   PT Evaluation $PT Eval Low Complexity: 1 Procedure PT Treatments $Gait Training: 8-22 mins   PT G Codes:        Margot ChimesBrittany Smith, PT, DPT  Acute Rehabilitation Services  Pager: [phone]   Melvyn NovasBrittany L Smith 01/26/2017, 5:19 PM

## 2017-01-26 NOTE — H&P (View-Only) (Signed)
TOTAL HIP ADMISSION H&P  Patient is admitted for right total hip arthroplasty.  Subjective:  Chief Complaint: right hip pain  HPI: Gwendolyn Burnett, 61 y.o. female, has a history of pain and functional disability in the right hip(s) due to arthritis and patient has failed non-surgical conservative treatments for greater than 12 weeks to include NSAID's and/or analgesics, flexibility and strengthening excercises, use of assistive devices, weight reduction as appropriate and activity modification.  Onset of symptoms was gradual starting 5 years ago with rapidlly worsening course since that time.The patient noted no past surgery on the right hip(s).  Patient currently rates pain in the right hip at 10 out of 10 with activity. Patient has night pain, worsening of pain with activity and weight bearing, pain that interfers with activities of daily living, pain with passive range of motion and crepitus. Patient has evidence of subchondral cysts, subchondral sclerosis, periarticular osteophytes and joint space narrowing by imaging studies. This condition presents safety issues increasing the risk of falls. There is no current active infection.  Patient Active Problem List   Diagnosis Date Noted  . Primary osteoarthritis of right hip 10/08/2015  . Primary osteoarthritis of both knees 10/08/2015   No past medical history on file.  No past surgical history on file.   (Not in a hospital admission) Allergies  Allergen Reactions  . Sulfamethoxazole Itching    Sulfa Drugs    Social History  Substance Use Topics  . Smoking status: Not on file  . Smokeless tobacco: Not on file  . Alcohol use Not on file    No family history on file.   Review of Systems  Constitutional: Negative.   HENT: Negative.   Eyes: Negative.   Respiratory: Negative.   Cardiovascular: Negative.   Gastrointestinal: Negative.   Genitourinary: Negative.   Musculoskeletal: Positive for joint pain.  Skin: Negative.    Neurological: Negative.   Endo/Heme/Allergies: Negative.   Psychiatric/Behavioral: Negative.     Objective:  Physical Exam  Vitals reviewed. Constitutional: She is oriented to person, place, and time. She appears well-developed and well-nourished.  HENT:  Head: Normocephalic and atraumatic.  Eyes: Conjunctivae and EOM are normal. Pupils are equal, round, and reactive to light.  Neck: Normal range of motion. Neck supple.  Cardiovascular: Normal rate, regular rhythm and intact distal pulses.   Respiratory: Effort normal and breath sounds normal.  GI: Soft. She exhibits no distension.  Genitourinary:  Genitourinary Comments: deferred  Musculoskeletal:       Right hip: She exhibits decreased range of motion, decreased strength, tenderness and crepitus.  Neurological: She is alert and oriented to person, place, and time. She has normal reflexes.  Skin: Skin is warm and dry.  Psychiatric: She has a normal mood and affect. Her behavior is normal. Judgment and thought content normal.    Vital signs in last 24 hours: @VSRANGES @  Labs:   There is no height or weight on file to calculate BMI.   Imaging Review Plain radiographs demonstrate severe degenerative joint disease of the right hip(s). The bone quality appears to be adequate for age and reported activity level.  Assessment/Plan:  End stage arthritis, right hip(s)  The patient history, physical examination, clinical judgement of the provider and imaging studies are consistent with end stage degenerative joint disease of the right hip(s) and total hip arthroplasty is deemed medically necessary. The treatment options including medical management, injection therapy, arthroscopy and arthroplasty were discussed at length. The risks and benefits of total hip arthroplasty were  presented and reviewed. The risks due to aseptic loosening, infection, stiffness, dislocation/subluxation,  thromboembolic complications and other imponderables  were discussed.  The patient acknowledged the explanation, agreed to proceed with the plan and consent was signed. Patient is being admitted for inpatient treatment for surgery, pain control, PT, OT, prophylactic antibiotics, VTE prophylaxis, progressive ambulation and ADL's and discharge planning.The patient is planning to be discharged home with HEP.

## 2017-01-26 NOTE — Transfer of Care (Signed)
Immediate Anesthesia Transfer of Care Note  Patient: Gwendolyn RudeSusan A Mapp  Procedure(s) Performed: Procedure(s) with comments: RIGHT TOTAL HIP ARTHROPLASTY ANTERIOR APPROACH (Right) - Needs RNFA  Patient Location: PACU  Anesthesia Type:Spinal  Level of Consciousness: drowsy and patient cooperative  Airway & Oxygen Therapy: Patient connected to face mask  Post-op Assessment: Report given to RN and Post -op Vital signs reviewed and stable  Post vital signs: Reviewed and stable  Last Vitals:  Vitals:   01/26/17 0644 01/26/17 1025  BP:    Pulse:    Resp:    Temp: 36.4 C 36.1 C    Last Pain:  Vitals:   01/26/17 1025  TempSrc: Oral  PainSc:       Patients Stated Pain Goal: 3 (01/26/17 0617)  Complications: No apparent anesthesia complications

## 2017-01-26 NOTE — Anesthesia Preprocedure Evaluation (Addendum)
Anesthesia Evaluation  Patient identified by MRN, date of birth, ID band Patient awake    Reviewed: Allergy & Precautions, NPO status , Patient's Chart, lab work & pertinent test results  History of Anesthesia Complications Negative for: history of anesthetic complications  Airway Mallampati: II   Neck ROM: Full    Dental no notable dental hx.    Pulmonary neg pulmonary ROS,    breath sounds clear to auscultation       Cardiovascular hypertension,  Rhythm:Regular Rate:Normal     Neuro/Psych    GI/Hepatic negative GI ROS, Neg liver ROS,   Endo/Other  negative endocrine ROSMorbid obesity  Renal/GU negative Renal ROS     Musculoskeletal  (+) Arthritis ,   Abdominal (+) + obese,   Peds  Hematology   Anesthesia Other Findings   Reproductive/Obstetrics                            Anesthesia Physical Anesthesia Plan  ASA: II  Anesthesia Plan: General and Spinal   Post-op Pain Management:    Induction: Intravenous  PONV Risk Score and Plan: 2 and Ondansetron and Dexamethasone  Airway Management Planned:   Additional Equipment:   Intra-op Plan:   Post-operative Plan:   Informed Consent: I have reviewed the patients History and Physical, chart, labs and discussed the procedure including the risks, benefits and alternatives for the proposed anesthesia with the patient or authorized representative who has indicated his/her understanding and acceptance.     Plan Discussed with: CRNA  Anesthesia Plan Comments:        Anesthesia Quick Evaluation

## 2017-01-26 NOTE — Discharge Instructions (Signed)
°Dr. Michalene Debruler °Joint Replacement Specialist °[facility] °[address] °[place], [place] [address] °[phone] ° ° °TOTAL HIP REPLACEMENT POSTOPERATIVE DIRECTIONS ° ° ° °Hip Rehabilitation, Guidelines Following Surgery  ° °WEIGHT BEARING °Weight bearing as tolerated with assist device (walker, cane, etc) as directed, use it as long as suggested by your surgeon or therapist, typically at least 4-6 weeks. ° °The results of a hip operation are greatly improved after range of motion and muscle strengthening exercises. Follow all safety measures which are given to protect your hip. If any of these exercises cause increased pain or swelling in your joint, decrease the amount until you are comfortable again. Then slowly increase the exercises. Call your caregiver if you have problems or questions.  ° °HOME CARE INSTRUCTIONS  °Most of the following instructions are designed to prevent the dislocation of your new hip.  °Remove items at home which could result in a fall. This includes throw rugs or furniture in walking pathways.  °Continue medications as instructed at time of discharge. °· You may have some home medications which will be placed on hold until you complete the course of blood thinner medication. °· You may start showering once you are discharged home. Do not remove your dressing. °Do not put on socks or shoes without following the instructions of your caregivers.   °Sit on chairs with arms. Use the chair arms to help push yourself up when arising.  °Arrange for the use of a toilet seat elevator so you are not sitting low.  °· Walk with walker as instructed.  °You may resume a sexual relationship in one month or when given the OK by your caregiver.  °Use walker as long as suggested by your caregivers.  °You may put full weight on your legs and walk as much as is comfortable. °Avoid periods of inactivity such as sitting longer than an hour when not asleep. This helps prevent  blood clots.  °You may return to work once you are cleared by your surgeon.  °Do not drive a car for 6 weeks or until released by your surgeon.  °Do not drive while taking narcotics.  °Wear elastic stockings for two weeks following surgery during the day but you may remove then at night.  °Make sure you keep all of your appointments after your operation with all of your doctors and caregivers. You should call the office at the above phone number and make an appointment for approximately two weeks after the date of your surgery. °Please pick up a stool softener and laxative for home use as long as you are requiring pain medications. °· ICE to the affected hip every three hours for 30 minutes at a time and then as needed for pain and swelling. Continue to use ice on the hip for pain and swelling from surgery. You may notice swelling that will progress down to the foot and ankle.  This is normal after surgery.  Elevate the leg when you are not up walking on it.   °It is important for you to complete the blood thinner medication as prescribed by your doctor. °· Continue to use the breathing machine which will help keep your temperature down.  It is common for your temperature to cycle up and down following surgery, especially at night when you are not up moving around and exerting yourself.  The breathing machine keeps your lungs expanded and your temperature down. ° °RANGE OF MOTION AND STRENGTHENING EXERCISES  °These exercises are   designed to help you keep full movement of your hip joint. Follow your caregiver's or physical therapist's instructions. Perform all exercises about fifteen times, three times per day or as directed. Exercise both hips, even if you have had only one joint replacement. These exercises can be done on a training (exercise) mat, on the floor, on a table or on a bed. Use whatever works the best and is most comfortable for you. Use music or television while you are exercising so that the exercises  are a pleasant break in your day. This will make your life better with the exercises acting as a break in routine you can look forward to.  °Lying on your back, slowly slide your foot toward your buttocks, raising your knee up off the floor. Then slowly slide your foot back down until your leg is straight again.  °Lying on your back spread your legs as far apart as you can without causing discomfort.  °Lying on your side, raise your upper leg and foot straight up from the floor as far as is comfortable. Slowly lower the leg and repeat.  °Lying on your back, tighten up the muscle in the front of your thigh (quadriceps muscles). You can do this by keeping your leg straight and trying to raise your heel off the floor. This helps strengthen the largest muscle supporting your knee.  °Lying on your back, tighten up the muscles of your buttocks both with the legs straight and with the knee bent at a comfortable angle while keeping your heel on the floor.  ° °SKILLED REHAB INSTRUCTIONS: °If the patient is transferred to a skilled rehab facility following release from the hospital, a list of the current medications will be sent to the facility for the patient to continue.  When discharged from the skilled rehab facility, please have the facility set up the patient's Home Health Physical Therapy prior to being released. Also, the skilled facility will be responsible for providing the patient with their medications at time of release from the facility to include their pain medication and their blood thinner medication. If the patient is still at the rehab facility at time of the two week follow up appointment, the skilled rehab facility will also need to assist the patient in arranging follow up appointment in our office and any transportation needs. ° °MAKE SURE YOU:  °Understand these instructions.  °Will watch your condition.  °Will get help right away if you are not doing well or get worse. ° °Pick up stool softner and  laxative for home use following surgery while on pain medications. °Do not remove your dressing. °The dressing is waterproof--it is OK to take showers. °Continue to use ice for pain and swelling after surgery. °Do not use any lotions or creams on the incision until instructed by your surgeon. °Total Hip Protocol. ° ° °

## 2017-01-26 NOTE — Anesthesia Procedure Notes (Signed)
Spinal  Patient location during procedure: OR Start time: 01/26/2017 7:55 AM End time: 01/26/2017 8:00 AM Staffing Anesthesiologist: Sharee HolsterMASSAGEE, Akhil Piscopo Performed: anesthesiologist  Preanesthetic Checklist Completed: patient identified, site marked, surgical consent, pre-op evaluation, timeout performed, IV checked, risks and benefits discussed and monitors and equipment checked Spinal Block Patient position: sitting Prep: ChloraPrep Patient monitoring: heart rate, cardiac monitor, continuous pulse ox and blood pressure Approach: midline Location: L3-4 Injection technique: single-shot Needle Needle type: Pencan  Needle gauge: 24 G Needle length: 9 cm Needle insertion depth: 4 cm Assessment Sensory level: T6

## 2017-01-26 NOTE — Interval H&P Note (Signed)
History and Physical Interval Note:  01/26/2017 7:46 AM  Gwendolyn Burnett  has presented today for surgery, with the diagnosis of Degenerative joint disease, right hip  The various methods of treatment have been discussed with the patient and family. After consideration of risks, benefits and other options for treatment, the patient has consented to  Procedure(s) with comments: RIGHT TOTAL HIP ARTHROPLASTY ANTERIOR APPROACH (Right) - Needs RNFA as a surgical intervention .  The patient's history has been reviewed, patient examined, no change in status, stable for surgery.  I have reviewed the patient's chart and labs.  Questions were answered to the patient's satisfaction.     Sarahy Creedon, Cloyde ReamsBrian James

## 2017-01-26 NOTE — Op Note (Signed)
OPERATIVE REPORT  SURGEON: Ameera Tigue, MD   Samson FredericASSISTANT: Hart CarwinJustin Queen, RNFA.  PREOPERATIVE DIAGNOSIS: Right hip arthritis.   POSTOPERATIVE DIAGNOSIS: Right hip arthritis.   PROCEDURE: Right total hip arthroplasty, anterior approach.   IMPLANTS: Biomet Taperloc Complete Microplasty stem, size 11 x 107.5, high offset. Biomet G7 Cup, size 52 mm. Biomet E1 liner, size 32 mm, E, neutral. Biomet Bioolox ceramic head ball, size 32 + 3 mm.  ANESTHESIA:  Spinal  ESTIMATED BLOOD LOSS: 600 mL.  ANTIBIOTICS: 2 g Ancef.  DRAINS: None.  COMPLICATIONS: None.   CONDITION: PACU - hemodynamically stable.   BRIEF CLINICAL NOTE: Gwendolyn Burnett is a 61 y.o. female with a long-standing history of Right hip arthritis. After failing conservative management, the patient was indicated for total hip arthroplasty. The risks, benefits, and alternatives to the procedure were explained, and the patient elected to proceed.  PROCEDURE IN DETAIL: Surgical site was marked by myself in the pre-op holding area. Once inside the operating room, spinal anesthesia was obtained, and a foley catheter was inserted. The patient was then positioned on the Hana table. All bony prominences were well padded. The hip was prepped and draped in the normal sterile surgical fashion. A time-out was called verifying side and site of surgery. The patient received IV antibiotics within 60 minutes of beginning the procedure.  The direct anterior approach to the hip was performed through the Hueter interval. Lateral femoral circumflex vessels were treated with the Auqumantys. The anterior capsule was exposed and an inverted T capsulotomy was made.The femoral neck cut was made to the level of the templated cut. A corkscrew was placed into the head and the head was removed. The femoral head was found to have eburnated bone. The head was passed to the back table and was measured.  Acetabular exposure was achieved, and the  pulvinar and labrum were excised. Sequential reaming of the acetabulum was then performed up to a size 51 mm reamer. A 52 mm cup was then opened and impacted into place at approximately 40 degrees of abduction and 20 degrees of anteversion. The final polyethylene liner was impacted into place and acetabular osteophytes were removed.   I then gained femoral exposure taking care to protect the abductors and greater trochanter. This was performed using standard external rotation, extension, and adduction. The capsule was peeled off the inner aspect of the greater trochanter, taking care to preserve the short external rotators. A cookie cutter was used to enter the femoral canal, and then the femoral canal finder was placed. Sequential broaching was performed up to a size 11. Calcar planer was used on the femoral neck remnant. I placed a hi offset neck and a trial head ball. The hip was reduced. Leg lengths and offset were checked fluoroscopically. The hip was dislocated and trial components were removed. The final implants were placed, and the hip was reduced.  Fluoroscopy was used to confirm component position and leg lengths. At 90 degrees of external rotation and full extension, the hip was stable to an anterior directed force.  The wound was copiously irrigated with normal saline using pulse lavage. Marcaine solution was injected into the periarticular soft tissue. The wound was closed in layers using #1 Vicryl and V-Loc for the fascia, 2-0 Vicryl for the subcutaneous fat, 2-0 Monocryl for the deep dermal layer, 3-0 running Monocryl subcuticular stitch, and Dermabond for the skin. Once the glue was fully dried, an Aquacell Ag dressing was applied. The patient was transported to the recovery  room in stable condition. Sponge, needle, and instrument counts were correct at the end of the case x2. The patient tolerated the procedure well and there were no known complications.

## 2017-01-27 ENCOUNTER — Encounter (HOSPITAL_COMMUNITY): Payer: Self-pay | Admitting: Orthopedic Surgery

## 2017-01-27 LAB — CBC
HEMATOCRIT: 28.9 % — AB (ref 36.0–46.0)
HEMOGLOBIN: 9.4 g/dL — AB (ref 12.0–15.0)
MCH: 31.2 pg (ref 26.0–34.0)
MCHC: 32.5 g/dL (ref 30.0–36.0)
MCV: 96 fL (ref 78.0–100.0)
Platelets: 144 10*3/uL — ABNORMAL LOW (ref 150–400)
RBC: 3.01 MIL/uL — AB (ref 3.87–5.11)
RDW: 13 % (ref 11.5–15.5)
WBC: 6.1 10*3/uL (ref 4.0–10.5)

## 2017-01-27 LAB — BASIC METABOLIC PANEL
Anion gap: 8 (ref 5–15)
BUN: 9 mg/dL (ref 6–20)
CHLORIDE: 106 mmol/L (ref 101–111)
CO2: 25 mmol/L (ref 22–32)
Calcium: 8.2 mg/dL — ABNORMAL LOW (ref 8.9–10.3)
Creatinine, Ser: 0.53 mg/dL (ref 0.44–1.00)
GFR calc Af Amer: >60 mL/min (ref >60)
GFR calc non Af Amer: >60 mL/min (ref >60)
GLUCOSE: 127 mg/dL — AB (ref 65–99)
POTASSIUM: 3.8 mmol/L (ref 3.5–5.1)
Sodium: 139 mmol/L (ref 135–145)

## 2017-01-27 MED ORDER — DOCUSATE SODIUM 100 MG PO CAPS: 100.0000 mg | ORAL_CAPSULE | Freq: Two times a day (BID) | ORAL | 1 refills | Status: DC

## 2017-01-27 MED ORDER — SENNA 8.6 MG PO TABS: 2.0000 | ORAL_TABLET | Freq: Every day | ORAL | 0 refills | Status: DC

## 2017-01-27 MED ORDER — HYDROCODONE-ACETAMINOPHEN 5-325 MG PO TABS: 1.0000 | ORAL_TABLET | ORAL | 0 refills | Status: DC | PRN

## 2017-01-27 MED ORDER — ONDANSETRON HCL 4 MG PO TABS: 4.0000 mg | ORAL_TABLET | Freq: Four times a day (QID) | ORAL | 0 refills | Status: DC | PRN

## 2017-01-27 MED ORDER — ASPIRIN 81 MG PO CHEW: 81.0000 mg | CHEWABLE_TABLET | Freq: Two times a day (BID) | ORAL | 1 refills | Status: DC

## 2017-01-27 NOTE — Progress Notes (Signed)
Pt discharged after d/c instructions and prescriptions given. All questions answered. Pt d/c via wheelchair accompanied by NT and family. Iv discontinued.

## 2017-01-27 NOTE — Discharge Summary (Signed)
Physician Discharge Summary  Patient ID: Gwendolyn Burnett MRN: [id number] DOB/AGE: 1956/05/23 61 y.o.  Admit date: 01/26/2017 Discharge date: 01/27/2017  Admission Diagnoses:  Primary osteoarthritis of right hip  Discharge Diagnoses:  Principal Problem:   Primary osteoarthritis of right hip Active Problems:   Osteoarthritis of right hip   Past Medical History:  Diagnosis Date  . Anxiety   . Arthritis   . Frequent UTI   . Headache    migranes  . Hematuria   . History of UTI   . Hypertension   . Vaginal delivery    2x    Surgeries: Procedure(s): RIGHT TOTAL HIP ARTHROPLASTY ANTERIOR APPROACH on 01/26/2017   Consultants (if any):   Discharged Condition: Improved  Hospital Course: Gwendolyn Burnett is an 61 y.o. female who was admitted 01/26/2017 with a diagnosis of Primary osteoarthritis of right hip and went to the operating room on 01/26/2017 and underwent the above named procedures.    She was given perioperative antibiotics:  Anti-infectives    Start     Dose/Rate Route Frequency Ordered Stop   01/26/17 1400  ceFAZolin (ANCEF) IVPB 2g/100 mL premix     2 g 200 mL/hr over 30 Minutes Intravenous Every 6 hours 01/26/17 1156 01/26/17 2027   01/26/17 0700  ceFAZolin (ANCEF) IVPB 2g/100 mL premix     2 g 200 mL/hr over 30 Minutes Intravenous To ShortStay Surgical 01/23/17 1125 01/26/17 0825    .  She was given sequential compression devices, early ambulation, and ASA for DVT prophylaxis.  She benefited maximally from the hospital stay and there were no complications.    Recent vital signs:  Vitals:   01/26/17 1950 01/27/17 0430  BP: (!) 122/55 126/60  Pulse: 93 85  Resp: 18 18  Temp: 98.3 F (36.8 C) 97.9 F (36.6 C)    Recent laboratory studies:  Lab Results  Component Value Date   HGB 9.4 (L) 01/27/2017   HGB 13.6 01/15/2017   Lab Results  Component Value Date   WBC 6.1 01/27/2017   PLT 144 (L) 01/27/2017   No results found for: INR Lab Results   Component Value Date   NA 139 01/27/2017   K 3.8 01/27/2017   CL 106 01/27/2017   CO2 25 01/27/2017   BUN 9 01/27/2017   CREATININE 0.53 01/27/2017   GLUCOSE 127 (H) 01/27/2017    Discharge Medications:   Allergies as of 01/27/2017      Reactions   Sulfamethoxazole Itching      Medication List    STOP taking these medications   acetaminophen 500 MG tablet Commonly known as:  TYLENOL   aspirin 325 MG EC tablet Replaced by:  aspirin 81 MG chewable tablet   meloxicam 15 MG tablet Commonly known as:  MOBIC   valsartan 80 MG tablet Commonly known as:  DIOVAN     TAKE these medications   ALPRAZolam 0.5 MG tablet Commonly known as:  XANAX Take 0.25 mg by mouth 2 (two) times daily as needed for anxiety or sleep (never takes a whole tablet).   aspirin 81 MG chewable tablet Chew 1 tablet (81 mg total) by mouth 2 (two) times daily. Replaces:  aspirin 325 MG EC tablet   azelastine 0.1 % nasal spray Commonly known as:  ASTELIN Place 1 spray into both nostrils 2 (two) times daily. Use in each nostril as directed   bismuth subsalicylate 262 MG/15ML suspension Commonly known as:  PEPTO BISMOL Take 30 mLs by mouth  every 6 (six) hours as needed.   cephALEXin 250 MG capsule Commonly known as:  KEFLEX Take 250 mg by mouth daily as needed (after intercourse for prevention of UTI).   ciprofloxacin 250 MG tablet Commonly known as:  CIPRO Take 250 mg by mouth daily as needed (ater intercourse for prevention of UTI).   docusate sodium 100 MG capsule Commonly known as:  COLACE Take 1 capsule (100 mg total) by mouth 2 (two) times daily.   fexofenadine 180 MG tablet Commonly known as:  ALLEGRA Take 180 mg by mouth every evening.   HYDROcodone-acetaminophen 5-325 MG tablet Commonly known as:  NORCO/VICODIN Take 1-2 tablets by mouth every 4 (four) hours as needed (breakthrough pain).   ibuprofen 200 MG tablet Commonly known as:  ADVIL,MOTRIN Take 400 mg by mouth every 8  (eight) hours as needed for mild pain.   NAPHCON-A OP Apply 1 drop to eye 2 (two) times daily as needed (allergies).   nitrofurantoin 100 MG capsule Commonly known as:  MACRODANTIN Take 100 mg by mouth 2 (two) times daily.   ondansetron 4 MG tablet Commonly known as:  ZOFRAN Take 1 tablet (4 mg total) by mouth every 6 (six) hours as needed for nausea.   senna 8.6 MG Tabs tablet Commonly known as:  SENOKOT Take 2 tablets (17.2 mg total) by mouth at bedtime.   SUMAtriptan 100 MG tablet Commonly known as:  IMITREX Take 100 mg by mouth every 2 (two) hours as needed for migraine.   SUPER B COMPLEX PO Take 1 tablet by mouth daily.   vitamin C 1000 MG tablet Take 1,000 mg by mouth daily.   Vitamin D-3 5000 units Tabs Take 5,000 Units by mouth every other day.       Diagnostic Studies: Dg Pelvis Portable  Result Date: 01/26/2017 CLINICAL DATA:  Status post right hip surgery EXAM: PORTABLE PELVIS 1-2 VIEWS COMPARISON:  Intraoperative films from earlier in the same day FINDINGS: Right hip replacement is noted. No acute bony abnormality is seen. Mild degenerative changes in the lumbar spine and pubic symphysis are noted. No soft tissue abnormality is seen. IMPRESSION: Status post right hip replacement Electronically Signed   By: Alcide Clever M.D.   On: 01/26/2017 11:59   Dg C-arm 1-60 Min  Result Date: 01/26/2017 CLINICAL DATA:  Right total hip arthroplasty. EXAM: DG C-ARM 61-120 MIN; OPERATIVE RIGHT HIP WITH PELVIS COMPARISON:  10/08/2015. FINDINGS: 2 intraoperative fluoroscopic spot views of the right hip show a right total hip arthroplasty. Femoral stem appears well seated. IMPRESSION: Intraoperative visualization for right total hip arthroplasty. Electronically Signed   By: Leanna Battles M.D.   On: 01/26/2017 10:45   Dg Hip Operative Unilat W Or W/o Pelvis Right  Result Date: 01/26/2017 CLINICAL DATA:  Right total hip arthroplasty. EXAM: DG C-ARM 61-120 MIN; OPERATIVE RIGHT HIP  WITH PELVIS COMPARISON:  10/08/2015. FINDINGS: 2 intraoperative fluoroscopic spot views of the right hip show a right total hip arthroplasty. Femoral stem appears well seated. IMPRESSION: Intraoperative visualization for right total hip arthroplasty. Electronically Signed   By: Leanna Battles M.D.   On: 01/26/2017 10:45    Disposition: 01-Home or Self Care  Discharge Instructions    Call MD / Call 911    Complete by:  As directed    If you experience chest pain or shortness of breath, CALL 911 and be transported to the hospital emergency room.  If you develope a fever above 101 F, pus (white drainage) or increased  drainage or redness at the wound, or calf pain, call your surgeon's office.   Constipation Prevention    Complete by:  As directed    Drink plenty of fluids.  Prune juice may be helpful.  You may use a stool softener, such as Colace (over the counter) 100 mg twice a day.  Use MiraLax (over the counter) for constipation as needed.   Diet - low sodium heart healthy    Complete by:  As directed    Driving restrictions    Complete by:  As directed    No driving for 6 weeks   Increase activity slowly as tolerated    Complete by:  As directed    Lifting restrictions    Complete by:  As directed    No lifting for 6 weeks   TED hose    Complete by:  As directed    Use stockings (TED hose) for 2 weeks on both leg(s).  You may remove them at night for sleeping.      Follow-up Information    Tanaya Dunigan, Arlys JohnBrian, MD. Schedule an appointment as soon as possible for a visit in 2 week(s).   Specialty:  Orthopedic Surgery Why:  For wound re-check Contact information: [address] [place] [address] [phone]            Signed: Garnet KoyanagiSwinteck, Saw Mendenhall James 01/27/2017, 5:13 PM

## 2017-01-27 NOTE — Progress Notes (Signed)
   Subjective:  Patient reports pain as mild to moderate.  Denies N/V/CP/SOB.  Objective:   VITALS:   Vitals:   01/26/17 1131 01/26/17 1300 01/26/17 1950 01/27/17 0430  BP:  (!) 108/55 (!) 122/55 126/60  Pulse: (!) 55 (!) 56 93 85  Resp: 17 17 18 18   Temp:  97.3 F (36.3 C) 98.3 F (36.8 C) 97.9 F (36.6 C)  TempSrc:  Oral Oral Oral  SpO2: 100% 100% 100% 97%  Weight:      Height:        NAD ABD soft Sensation intact distally Intact pulses distally Dorsiflexion/Plantar flexion intact Incision: dressing C/D/I Compartment soft   Lab Results  Component Value Date   WBC 6.1 01/27/2017   HGB 9.4 (L) 01/27/2017   HCT 28.9 (L) 01/27/2017   MCV 96.0 01/27/2017   PLT 144 (L) 01/27/2017   BMET    Component Value Date/Time   NA 139 01/27/2017 0440   K 3.8 01/27/2017 0440   CL 106 01/27/2017 0440   CO2 25 01/27/2017 0440   GLUCOSE 127 (H) 01/27/2017 0440   BUN 9 01/27/2017 0440   CREATININE 0.53 01/27/2017 0440   CALCIUM 8.2 (L) 01/27/2017 0440   GFRNONAA >60 01/27/2017 0440   GFRAA >60 01/27/2017 0440     Assessment/Plan: 1 Day Post-Op   Principal Problem:   Primary osteoarthritis of right hip Active Problems:   Osteoarthritis of right hip   WBAT with walker ASA, SCDs, TEDS PO pain control PT/OT Dispo: D/C home with HEP, no HH services required   Garnet KoyanagiSwinteck, Tynleigh Birt James 01/27/2017, 8:00 AM   Samson FredericBrian Enza Shone, MD Cell [phone]

## 2017-01-27 NOTE — Progress Notes (Signed)
Physical Therapy Treatment Patient Details Name: Gwendolyn Burnett Today's Date: 01/27/2017    History of Present Illness Pt is a 61 y/o female s/p elective R THA; direct anterior approproache. PMH includes HTN.     PT Comments    Pt performed increased activity during session this am.  PTA reviewed stair training and completed HEP for carryover at home.  Informed RN patient is ready to d/c at this time.  Pt remains appropriate to return home with support from family and friends.    Follow Up Recommendations  DC plan and follow up therapy as arranged by surgeon;Supervision/Assistance - 24 hour     Equipment Recommendations  None recommended by PT    Recommendations for Other Services       Precautions / Restrictions Precautions Precautions: None Precaution Comments: Reviewed supine, seated and standing ther ex on THA handout.  Restrictions Weight Bearing Restrictions: No RLE Weight Bearing: Weight bearing as tolerated    Mobility  Bed Mobility Overal bed mobility: Needs Assistance             General bed mobility comments: Pt in recliner on arrival.    Transfers Overall transfer level: Needs assistance Equipment used: Rolling walker (2 wheeled) Transfers: Sit to/from Stand Sit to Stand: Supervision         General transfer comment: Cues for hand placement to and from seated surface.    Ambulation/Gait Ambulation/Gait assistance: Supervision Ambulation Distance (Feet): 300 Feet Assistive device: Rolling walker (2 wheeled) Gait Pattern/deviations: Step-through pattern;Antalgic;Trunk flexed Gait velocity: Decreased   General Gait Details: Cues for upper trunk control. Cues for increasing stride length.     Stairs Stairs: Yes   Stair Management: No rails;Backwards;Forwards;With walker Number of Stairs: 4 General stair comments: Cues for sequencing and RW placement.  PTA performed x2 stairs and then husband and patient  performed x2 stairs with PTA close for guidance.  Good carryover observed.  Min assist to stabilize RW.    Wheelchair Mobility    Modified Rankin (Stroke Patients Only)       Balance Overall balance assessment: Needs assistance Sitting-balance support: No upper extremity supported;Feet supported Sitting balance-Leahy Scale: Good     Standing balance support: Bilateral upper extremity supported;During functional activity Standing balance-Leahy Scale: Poor Standing balance comment: Reliant on RW for stability                             Cognition Arousal/Alertness: Awake/alert Behavior During Therapy: WFL for tasks assessed/performed Overall Cognitive Status: Within Functional Limits for tasks assessed                                        Exercises Total Joint Exercises Ankle Circles/Pumps: AROM;Both;10 reps;Supine Quad Sets: AROM;Right;10 reps;Supine Short Arc Quad: AROM;Right;10 reps;Supine Heel Slides: AROM;Right;10 reps;Supine Hip ABduction/ADduction: AROM;Right;Supine;Standing;20 reps (1x10 in supine and 1x10 in standing.  ) Knee Flexion: AROM;Right;10 reps;Supine Marching in Standing: AROM;Right;10 reps;Supine Standing Hip Extension: AROM;Right;10 reps;Supine    General Comments        Pertinent Vitals/Pain Pain Assessment: 0-10 Pain Score: 3  Pain Descriptors / Indicators: Aching;Sore;Operative site guarding Pain Intervention(s): Monitored during session;Repositioned    Home Living                      Prior Function  PT Goals (current goals can now be found in the care plan section) Acute Rehab PT Goals Patient Stated Goal: to go home  Potential to Achieve Goals: Good Progress towards PT goals: Progressing toward goals    Frequency    7X/week      PT Plan Current plan remains appropriate    Co-evaluation              AM-PAC PT "6 Clicks" Daily Activity  Outcome Measure  Difficulty  turning over in bed (including adjusting bedclothes, sheets and blankets)?: None Difficulty moving from lying on back to sitting on the side of the bed? : None Difficulty sitting down on and standing up from a chair with arms (e.g., wheelchair, bedside commode, etc,.)?: A Little Help needed moving to and from a bed to chair (including a wheelchair)?: A Little Help needed walking in hospital room?: A Little Help needed climbing 3-5 steps with a railing? : A Little 6 Click Score: 20    End of Session Equipment Utilized During Treatment: Gait belt Activity Tolerance: Patient tolerated treatment well Patient left: in bed;with call bell/phone within reach;with family/visitor present Nurse Communication: Mobility status;Other (comment) PT Visit Diagnosis: Other abnormalities of gait and mobility (R26.89);Pain Pain - Right/Left: Right Pain - part of body: Hip     Time: 0921-0955 PT Time Calculation (min) (ACUTE ONLY): 34 min  Charges:  $Gait Training: 8-22 mins $Therapeutic Exercise: 8-22 mins                    G Codes:       Joycelyn Rua, PTA pager [phone]    Florestine Avers 01/27/2017, 10:09 AM

## 2017-01-27 NOTE — Anesthesia Postprocedure Evaluation (Signed)
Anesthesia Post Note  Patient: Gwendolyn Burnett  Procedure(s) Performed: Procedure(s) (LRB): RIGHT TOTAL HIP ARTHROPLASTY ANTERIOR APPROACH (Right)     Patient location during evaluation: PACU Anesthesia Type: Spinal Level of consciousness: oriented and awake and alert Pain management: pain level controlled Vital Signs Assessment: post-procedure vital signs reviewed and stable Respiratory status: spontaneous breathing, respiratory function stable and patient connected to nasal cannula oxygen Cardiovascular status: blood pressure returned to baseline and stable Postop Assessment: no headache and no backache Anesthetic complications: no    Last Vitals:  Vitals:   01/26/17 1950 01/27/17 0430  BP: (!) 122/55 126/60  Pulse: 93 85  Resp: 18 18  Temp: 36.8 C 36.6 C    Last Pain:  Vitals:   01/27/17 0430  TempSrc: Oral  PainSc:     LLE Motor Response: Responds to commands;Purposeful movement (01/27/17 13240808) LLE Sensation: Full sensation (01/27/17 [id number]) RLE Motor Response: Responds to commands;Purposeful movement (01/27/17 27250808) RLE Sensation: Full sensation (01/27/17 0808)      Arshia Spellman,JAMES TERRILL

## 2017-01-27 NOTE — Addendum Note (Signed)
Addendum  created 01/27/17 0925 by Army FossaPulliam, Jisell Majer Dane, CRNA   Charge Capture section accepted, Visit diagnoses modified

## 2017-01-27 NOTE — Care Management Note (Signed)
Case Management Note  Patient Details  Name: Gwendolyn RudeSusan A Burnett MRN: [id number] Date of Birth: [birth year]  Subjective/Objective:   61 yr old female s/p right total hip arthroplasty.                 Action/Plan: Case manager spoke with patient and her husband concerning discharge plan and DME needs. Patient will not require HH therapy per Dr. Linna CapriceSwinteck. Has Rolling walker and 3in1. She will have family support at discharge.    Expected Discharge Date:  01/27/17               Expected Discharge Plan:  Home/Self Care  In-House Referral:  NA  Discharge planning Services  CM Consult  Post Acute Care Choice:   (patient has RW and 3in1) Choice offered to:  NA  DME Arranged:  N/A DME Agency:     HH Arranged:  NA HH Agency:  NA  Status of Service:  Completed, signed off  If discussed at Long Length of Stay Meetings, dates discussed:    Additional Comments:  Durenda GuthrieBrady, Madalena Naomi, RN 01/27/2017, 10:15 AM

## 2017-01-29 ENCOUNTER — Encounter (HOSPITAL_COMMUNITY): Payer: Self-pay | Admitting: Orthopedic Surgery

## 2018-01-13 ENCOUNTER — Ambulatory Visit (INDEPENDENT_AMBULATORY_CARE_PROVIDER_SITE_OTHER): Payer: BC Managed Care – PPO | Admitting: Orthopaedic Surgery

## 2018-01-13 ENCOUNTER — Ambulatory Visit (INDEPENDENT_AMBULATORY_CARE_PROVIDER_SITE_OTHER): Payer: BC Managed Care – PPO

## 2018-01-13 ENCOUNTER — Encounter (INDEPENDENT_AMBULATORY_CARE_PROVIDER_SITE_OTHER): Payer: Self-pay | Admitting: Orthopaedic Surgery

## 2018-01-13 DIAGNOSIS — M25562 Pain in left knee: Secondary | ICD-10-CM | POA: Diagnosis not present

## 2018-01-13 DIAGNOSIS — G8929 Other chronic pain: Secondary | ICD-10-CM | POA: Diagnosis not present

## 2018-01-13 MED ORDER — LIDOCAINE HCL 1 % IJ SOLN
3.0000 mL | INTRAMUSCULAR | Status: AC | PRN
  Administered 2018-01-13: 3 mL

## 2018-01-13 MED ORDER — METHYLPREDNISOLONE ACETATE 40 MG/ML IJ SUSP
40.0000 mg | INTRAMUSCULAR | Status: AC | PRN
  Administered 2018-01-13: 40 mg via INTRA_ARTICULAR

## 2018-01-13 NOTE — Progress Notes (Signed)
Office Visit Note   Patient: Gwendolyn Burnett           Date of Birth: July 21, 1956           MRN: [id number] Visit Date: 01/13/2018              Requested by: Gordan Payment., MD [address] [place], [place] [address] PCP: Gordan Payment., MD   Assessment & Plan: Visit Diagnoses:  1. Chronic pain of left knee     Plan: I spoke with her about trying a steroid injection her left knee and she is agreeable to this.  I do feel she is a perfect candidate for hyaluronic acid as well.  We will order Synvisc 1 for her knee.  I do feel that she would benefit from quad strengthening exercises and I showed her abundant amount of exercise to try.  All questions concerns were answered and addressed.  She will stop wearing a lift in her left shoe as well.  Follow-Up Instructions: Return in about 1 month (around 02/10/2018).   Orders:  Orders Placed This Encounter  Procedures  . Large Joint Inj  . XR Knee 1-2 Views Left   No orders of the defined types were placed in this encounter.     Procedures: Large Joint Inj: L knee on 01/13/2018 3:52 PM Indications: diagnostic evaluation and pain Details: 22 G 1.5 in needle, superolateral approach  Arthrogram: No  Medications: 3 mL lidocaine 1 %; 40 mg methylPREDNISolone acetate 40 MG/ML Outcome: tolerated well, no immediate complications Procedure, treatment alternatives, risks and benefits explained, specific risks discussed. Consent was given by the patient. Immediately prior to procedure a time out was called to verify the correct patient, procedure, equipment, support staff and site/side marked as required. Patient was prepped and draped in the usual sterile fashion.       Clinical Data: No additional findings.   Subjective: Chief Complaint  Patient presents with  . Left Knee - Pain  . Right Hip - Pain  The patient is here today for mainly her left knee.  This is the first time I seeing her.  She is been told in the past she had a  meniscal tear for that knee and over a year ago had an injection with steroid.  She actually also has a history of a right total hip arthroplasty done in 2018 x 1 of my colleagues in town.  This was through direct anterior approach.  She is been told by her husband as well as a physical therapist that she has a leg length discrepancy.  She does wear his left in her left shoe.  She does report some pain and instability with her left knee.  She denies any right hip pain and denies any right knee pain.  She is not diabetic.  She denies any previous injuries to that knee.  She has gained some weight and is working on trying to get back to the gym to exercise to lose weight but her knee is a limiting factor in terms of her left knee pain.  HPI  Review of Systems She currently denies any headache, chest pain, shortness of breath, fever, chills, nausea, vomiting  Objective: Vital Signs: There were no vitals taken for this visit.  Physical Exam She is alert and oriented x3 and in no acute distress Ortho Exam Examination of both hips show that he moves fluidly without any difficulty at all.  I later completely down supine on  exam table and I do not see a leg length discrepancy at all.  Her leg lengths are completely equal.  His imaging of her left knee shows no effusion but she does have significant patellofemoral crepitation and some slight medial lateral joint line tenderness but excellent strength. Specialty Comments:  No specialty comments available.  Imaging: Xr Knee 1-2 Views Left  Result Date: 01/13/2018 2 views of left knee show mainly mild lateral compartment arthritis but moderate patellofemoral arthritis.  There is no effusion and no acute injury.    PMFS History: Patient Active Problem List   Diagnosis Date Noted  . Osteoarthritis of right hip 01/26/2017  . Primary osteoarthritis of right hip 10/08/2015  . Primary osteoarthritis of both knees 10/08/2015   Past Medical History:    Diagnosis Date  . Anxiety   . Arthritis   . Frequent UTI   . Headache    migranes  . Hematuria   . History of UTI   . Hypertension   . Vaginal delivery    2x    History reviewed. No pertinent family history.  Past Surgical History:  Procedure Laterality Date  . COLONOSCOPY    . 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 TOTAL HIP ARTHROPLASTY Right 01/26/2017   Procedure: RIGHT TOTAL HIP ARTHROPLASTY ANTERIOR APPROACH;  Surgeon: Samson Frederic, MD;  Location: [facility] OR;  Service: Orthopedics;  Laterality: Right;  Needs RNFA   Social History   Occupational History  . Not on file  Tobacco Use  . Smoking status: Never Smoker  . Smokeless tobacco: Never Used  Substance and Sexual Activity  . Alcohol use: Yes    Comment: occasionally  . Drug use: No  . Sexual activity: Yes

## 2018-01-15 ENCOUNTER — Telehealth (INDEPENDENT_AMBULATORY_CARE_PROVIDER_SITE_OTHER): Payer: Self-pay

## 2018-01-15 NOTE — Telephone Encounter (Signed)
Submitted application online for SynviscOne injection, left knee.  

## 2018-02-02 ENCOUNTER — Telehealth (INDEPENDENT_AMBULATORY_CARE_PROVIDER_SITE_OTHER): Payer: Self-pay

## 2018-02-02 NOTE — Telephone Encounter (Signed)
Faxed completed PA form to [employer] at [phone].

## 2018-02-03 ENCOUNTER — Telehealth (INDEPENDENT_AMBULATORY_CARE_PROVIDER_SITE_OTHER): Payer: Self-pay

## 2018-02-03 NOTE — Telephone Encounter (Signed)
Patient is approved for SynviscOne, left knee. PA approved-Reference# [id number] Valid 02/02/2018- 02/02/2019 Buy & Bill Covered at 100%  Appt.scheduled with Dr. Magnus IvanBlackman

## 2018-02-17 ENCOUNTER — Encounter (INDEPENDENT_AMBULATORY_CARE_PROVIDER_SITE_OTHER): Payer: Self-pay | Admitting: Orthopaedic Surgery

## 2018-02-17 ENCOUNTER — Ambulatory Visit (INDEPENDENT_AMBULATORY_CARE_PROVIDER_SITE_OTHER): Payer: BC Managed Care – PPO | Admitting: Orthopaedic Surgery

## 2018-02-17 DIAGNOSIS — M1712 Unilateral primary osteoarthritis, left knee: Secondary | ICD-10-CM | POA: Insufficient documentation

## 2018-02-17 MED ORDER — HYALURONAN 88 MG/4ML IX SOSY
88.0000 mg | PREFILLED_SYRINGE | INTRA_ARTICULAR | Status: AC | PRN
  Administered 2018-02-17: 88 mg via INTRA_ARTICULAR

## 2018-02-17 MED ORDER — LIDOCAINE HCL 1 % IJ SOLN
3.0000 mL | INTRAMUSCULAR | Status: AC | PRN
  Administered 2018-02-17: 3 mL

## 2018-02-17 NOTE — Progress Notes (Signed)
   Procedure Note  Patient: Gwendolyn Burnett             Date of Birth: 10/31/1955           MRN: [id number]             Visit Date: 02/17/2018  Procedures: Visit Diagnoses: Unilateral primary osteoarthritis, left knee  Large Joint Inj: L knee on 02/17/2018 4:32 PM Indications: diagnostic evaluation and pain Details: 22 G 1.5 in needle, superolateral approach  Arthrogram: No  Medications: 3 mL lidocaine 1 %; 88 mg Hyaluronan 88 MG/4ML Outcome: tolerated well, no immediate complications Procedure, treatment alternatives, risks and benefits explained, specific risks discussed. Consent was given by the patient. Immediately prior to procedure a time out was called to verify the correct patient, procedure, equipment, support staff and site/side marked as required. Patient was prepped and draped in the usual sterile fashion.     The patient is here today for scheduled hyaluronic acid injection of Synvisc 1 her left knee to treat the pain from osteoarthritis of the knee.  She has had steroid injections before.  She says overall she feels like she is making progress and doing better overall.  She otherwise has had no other issues.  She had a total hip arthroplasty done elsewhere and was using a shoe lift for what she felt was a ligament discrepancy however she does not have a ligament discrepancy and after getting rid of that left she feels much better overall.  She is been working on quad strengthening exercises as well so she can tolerate going up and down stairs.  She understands the rationale behind a Synvisc 1 injection in the risk and benefits involved.  She tolerated this well.  All question concerns were answered and addressed.  Follow-up will be as needed.

## 2018-02-26 IMAGING — RF DG C-ARM 61-120 MIN
1 series · 2 of 2 positions shown · non-contrast
Comparison: 10/08/2015.

CLINICAL DATA: Right total hip arthroplasty.

EXAM:
DG C-ARM 61-120 MIN; OPERATIVE RIGHT HIP WITH PELVIS

[Series 1: run · 2 of 2 slices shown]
[im 1/2]
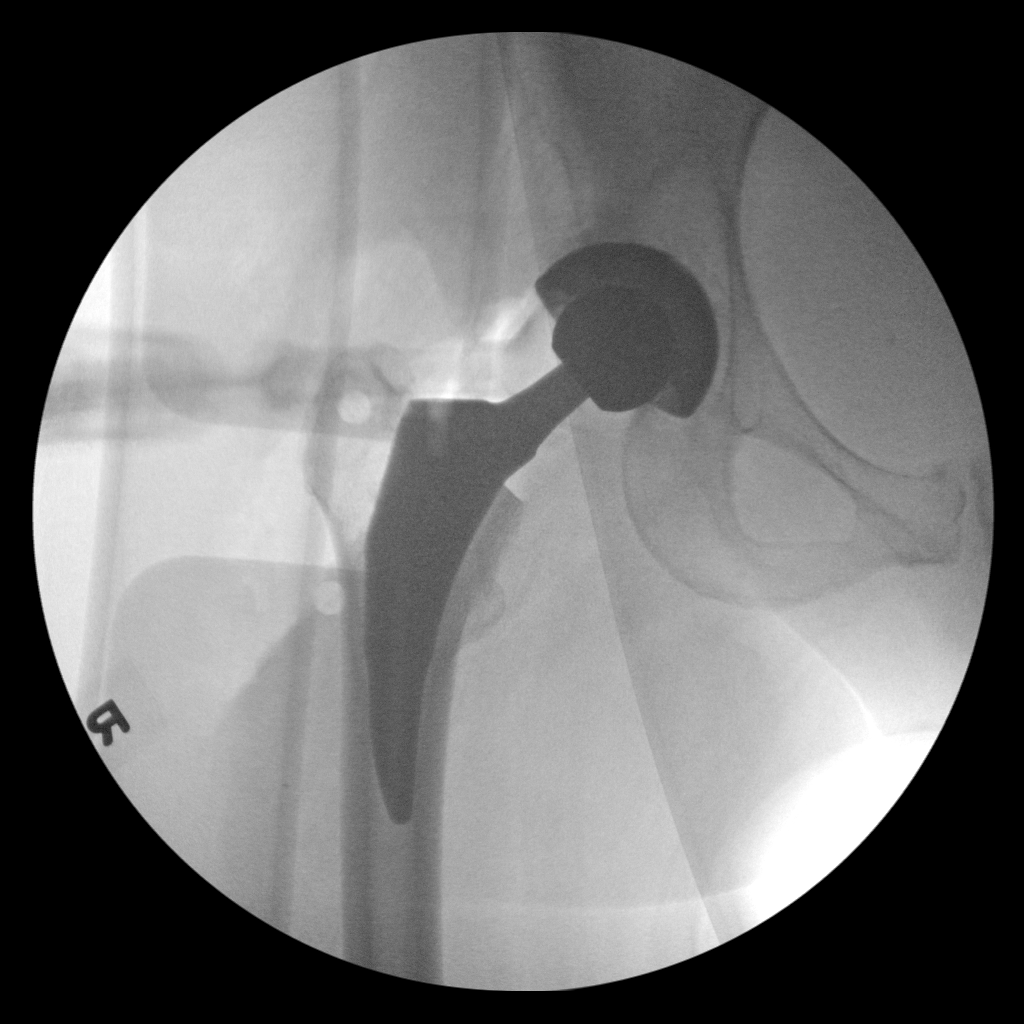
[im 2/2]
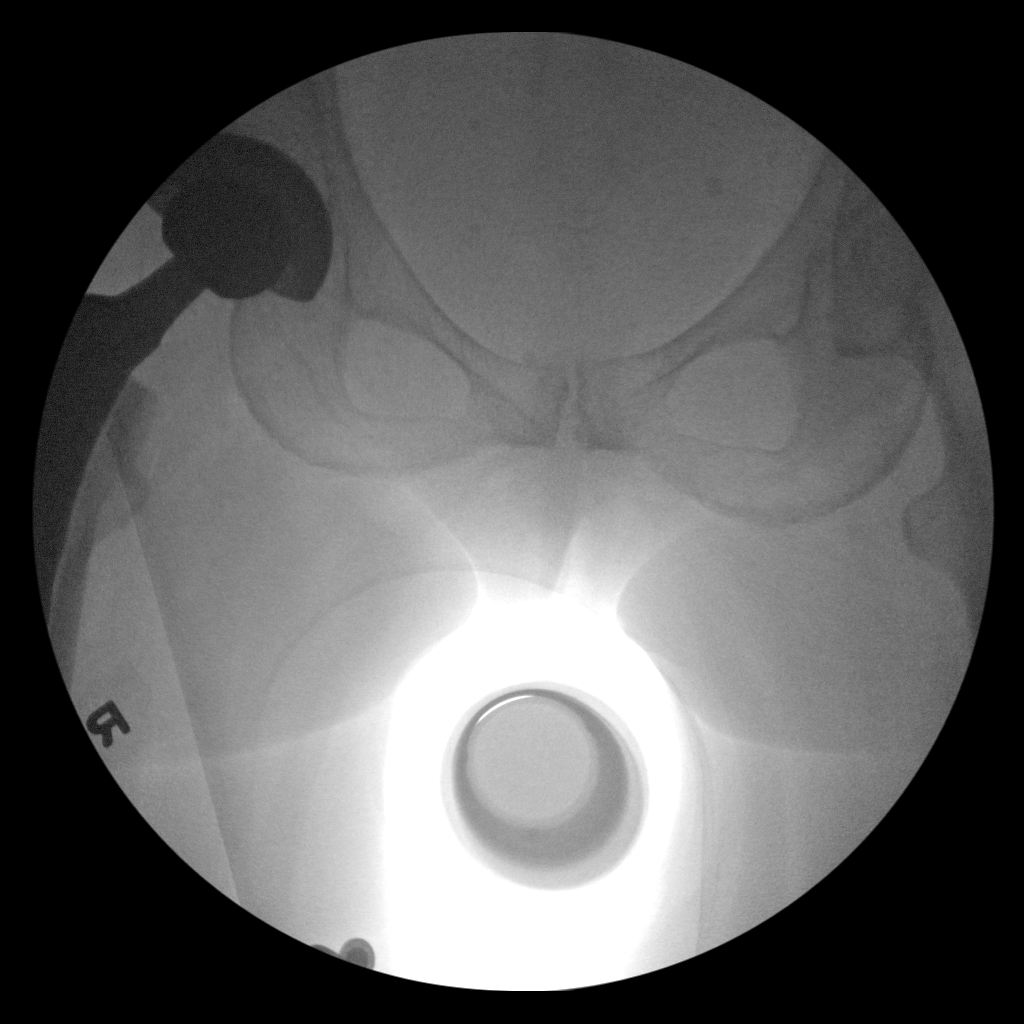

[2 of 2 positions shown; findings below may reference images not displayed]

FINDINGS: 2 intraoperative fluoroscopic spot views of the right hip show a
right total hip arthroplasty. Femoral stem appears well seated.
IMPRESSION: Intraoperative visualization for right total hip arthroplasty.

## 2018-02-26 IMAGING — CR DG PORTABLE PELVIS
1 series · 1 of 1 positions shown · non-contrast
Comparison: Intraoperative films from earlier in the same day

CLINICAL DATA: Status post right hip surgery

EXAM:
PORTABLE PELVIS 1-2 VIEWS

[AP]
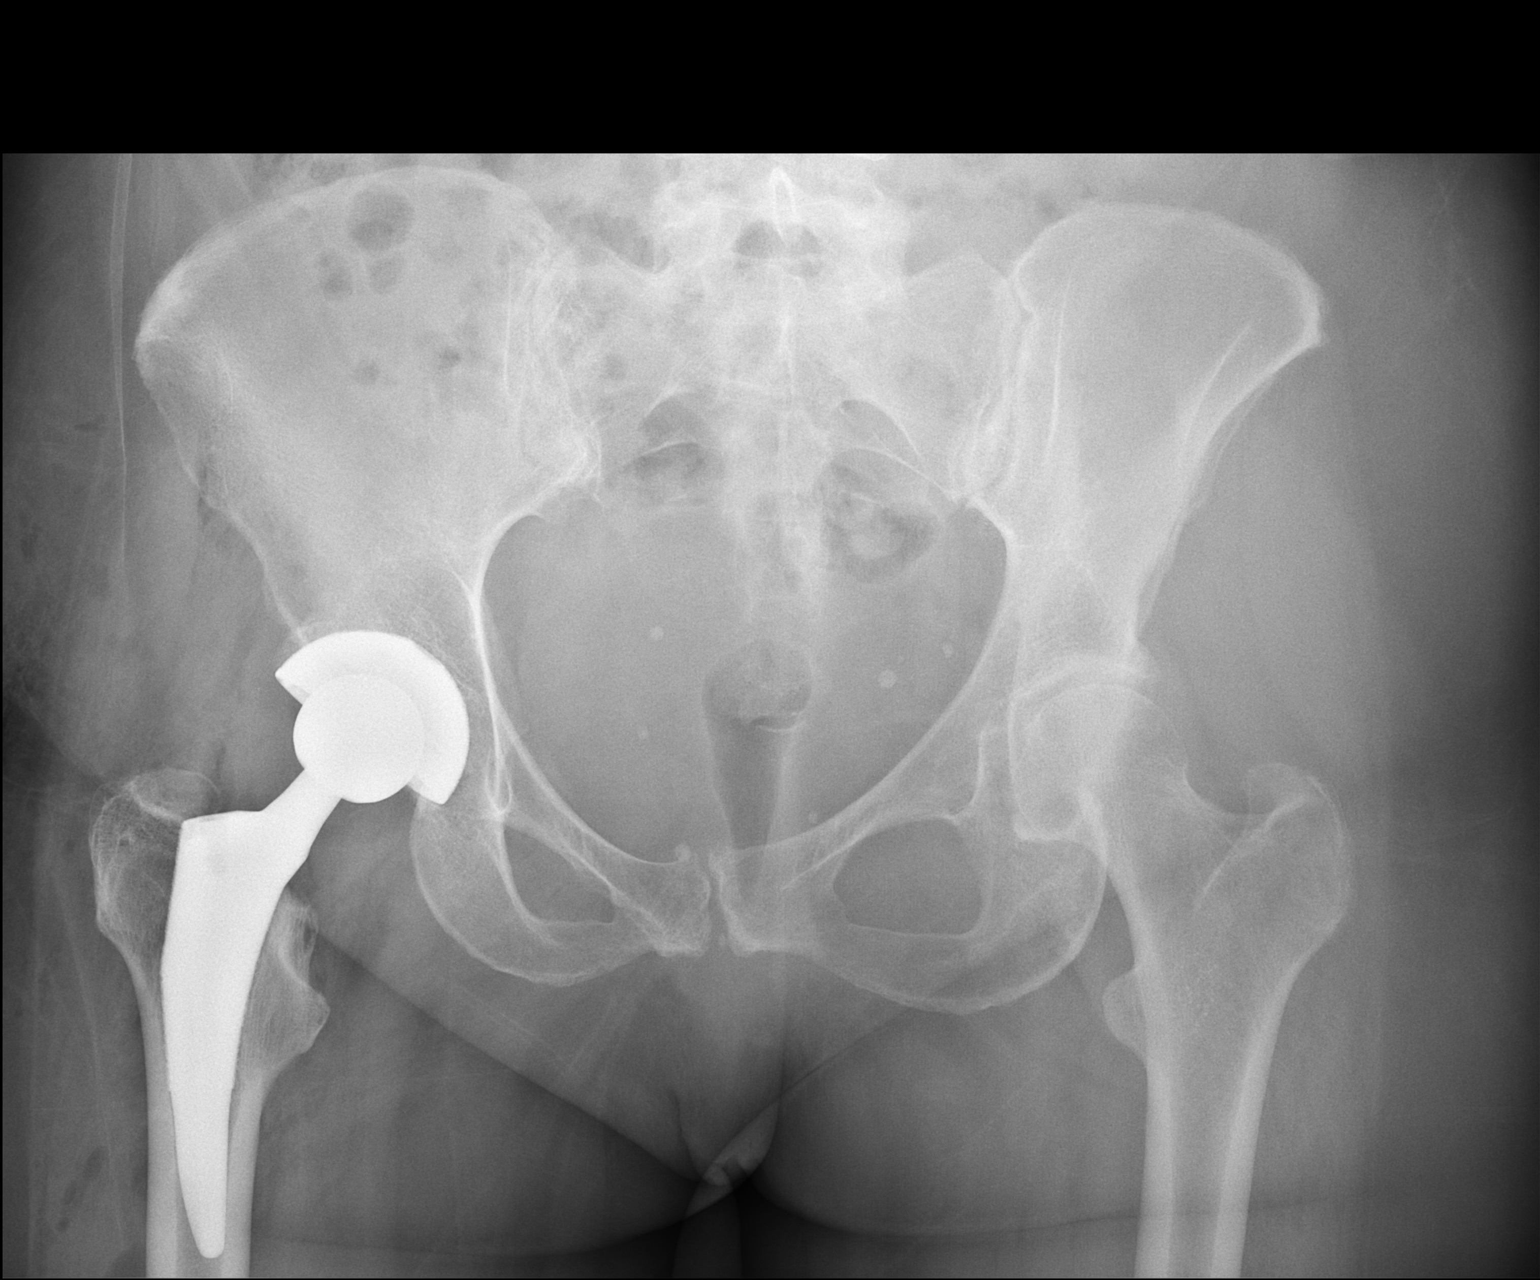

[1 of 1 positions shown; findings below may reference images not displayed]

FINDINGS: Right hip replacement is noted. No acute bony abnormality is seen.
Mild degenerative changes in the lumbar spine and pubic symphysis
are noted. No soft tissue abnormality is seen.
IMPRESSION: Status post right hip replacement

## 2018-04-08 ENCOUNTER — Encounter: Payer: Self-pay | Admitting: Allergy and Immunology

## 2018-04-08 ENCOUNTER — Ambulatory Visit: Payer: BC Managed Care – PPO | Admitting: Allergy and Immunology

## 2018-04-08 VITALS — BP 144/82 | HR 68 | Temp 98.6°F | Resp 16 | Ht 65.7 in | Wt 202.4 lb

## 2018-04-08 DIAGNOSIS — J301 Allergic rhinitis due to pollen: Secondary | ICD-10-CM

## 2018-04-08 DIAGNOSIS — H1010 Acute atopic conjunctivitis, unspecified eye: Secondary | ICD-10-CM

## 2018-04-08 DIAGNOSIS — R51 Headache: Secondary | ICD-10-CM

## 2018-04-08 DIAGNOSIS — R519 Headache, unspecified: Secondary | ICD-10-CM

## 2018-04-08 DIAGNOSIS — Z91018 Allergy to other foods: Secondary | ICD-10-CM | POA: Diagnosis not present

## 2018-04-08 MED ORDER — OLOPATADINE HCL 0.2 % OP SOLN: OPHTHALMIC | 5 refills | Status: DC

## 2018-04-08 NOTE — Patient Instructions (Addendum)
  1.  Allergen avoidance measures  2.  Treat and prevent inflammation:   A.  OTC Nasacort 1 spray each nostril 3-7 times a week  3.  Treat and prevent headache:   A.  Consolidate caffeine slowly aiming for none  4.  If needed:   A.  OTC antihistamine  B. Azelastine nasal spray  C.  Nasal saline spray  D.  Pataday 1 drop each eye 1 time per day  5.  Further evaluation for nut allergy: Nut panel with reflex  6.  Return to clinic in 8 weeks or earlier if problem  7.  Obtain fall flu vaccine

## 2018-04-08 NOTE — Progress Notes (Signed)
Dear Gwendolyn Burnett,  Thank you for referring Gwendolyn Burnett to the [facility] [place] on 04/08/2018.   Below is a summation of this patient's evaluation and recommendations.  Thank you for your referral. I will keep you informed about this patient's response to treatment.   If you have any questions please do not hesitate to contact me.   Sincerely,  Gwendolyn Priest, MD Allergy / Immunology [facility]   ______________________________________________________________________    NEW PATIENT NOTE  Referring Provider: Rhea Bleacher* Primary Provider: Krystal Clark, NP Date of office visit: 04/08/2018    Subjective:   Chief Complaint:  Gwendolyn Burnett (DOB: 10-Jun-1956) is a 62 y.o. female who presents to the clinic on 04/08/2018 with a chief complaint of Allergy Testing and Allergies .     HPI: Gwendolyn Burnett presents to this clinic in evaluation of 2 main issues.  First, she has a history of "allergies" with some nasal congestion and runny nose and sneezing and itchy eyes that appears to be more prevalent during the spring and fall season and has been a long-standing issue for which she will treat herself with antihistamines including nasal antihistamines.  In conjunction with these allergic symptoms is the development of headache.  She will develop a headache associated with dizziness upon awakening in the morning.  Usually she will get up, start drinking coffee, and within about 1 hour after arising from bed her headache will be gone.  This is definitely more prevalent during the spring and fall season.  This past spring she had a significant problem with these headaches and they started to erode further into the daytime.  Over the course of the past 2 months since the spring season has resolved she is much better at this point in time.  In the distant past there was a history of  migraines but that has been inactive for years.  She does drink a few cups of coffee per day.  Second, she has had an issue with the development of a reaction that occurred about 2 weeks ago after eating a breakfast bar and almonds.  About 60 minutes after eating the breakfast bar and 10 minutes after eating the almonds she developed these red flat itchy areas on her arms and legs that lasted about 30 minutes and was not associated with any systemic or constitutional symptoms.  As well, she thinks that she develops a small swelling area on her tongue almost like a "blister" but there is no fluid collection whenever she eats nuts.  This is not painful or red and is not associated with any systemic or constitutional symptoms and has been present for the past year or so.  Past Medical History:  Diagnosis Date  . Anxiety   . Arthritis   . Frequent UTI   . Headache    migranes  . Hematuria   . History of UTI   . Hypertension   . Vaginal delivery    2x    Past Surgical History:  Procedure Laterality Date  . COLONOSCOPY    . 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 TOTAL HIP ARTHROPLASTY Right 01/26/2017   Procedure: RIGHT TOTAL HIP ARTHROPLASTY ANTERIOR APPROACH;  Surgeon: Samson Frederic, MD;  Location: [facility] OR;  Service: Orthopedics;  Laterality: Right;  Needs RNFA    Allergies as of 04/08/2018      Reactions   Sulfamethoxazole Itching  Medication List      ALPRAZolam 0.5 MG tablet Commonly known as:  XANAX Take 0.25 mg by mouth 2 (two) times daily as needed for anxiety or sleep (never takes a whole tablet).   azelastine 0.1 % nasal spray Commonly known as:  ASTELIN Place 1 spray into both nostrils 2 (two) times daily. Use in each nostril as directed   ciprofloxacin 250 MG tablet Commonly known as:  CIPRO Take 250 mg by mouth daily as needed (ater intercourse for prevention of UTI).   fexofenadine 180 MG tablet Commonly known as:  ALLEGRA Take 180 mg by mouth every  evening.   losartan 50 MG tablet Commonly known as:  COZAAR Take 50 mg by mouth daily.   Olopatadine HCl 0.2 % Soln Use one drop in each eye once daily if needed for itchy, watery eyes.   TURMERIC PO Take by mouth.   Vitamin D-3 5000 units Tabs Take 5,000 Units by mouth every other day.       Review of systems negative except as noted in HPI / PMHx or noted below:  Review of Systems  Constitutional: Negative.   HENT: Negative.   Eyes: Negative.   Respiratory: Negative.   Cardiovascular: Negative.   Gastrointestinal: Negative.   Genitourinary: Negative.   Musculoskeletal: Negative.   Skin: Negative.   Neurological: Negative.   Endo/Heme/Allergies: Negative.   Psychiatric/Behavioral: Negative.     Family History  Problem Relation Age of Onset  . Asthma Father   . Congenital heart disease Father   . High Cholesterol Brother   . High Cholesterol Paternal Aunt     Social History   Socioeconomic History  . Marital status: Married    Spouse name: Not on file  . Number of children: Not on file  . Years of education: Not on file  . Highest education level: Not on file  Occupational History  . Not on file  Social Needs  . Financial resource strain: Not on file  . Food insecurity:    Worry: Not on file    Inability: Not on file  . Transportation needs:    Medical: Not on file    Non-medical: Not on file  Tobacco Use  . Smoking status: Never Smoker  . Smokeless tobacco: Never Used  Substance and Sexual Activity  . Alcohol use: Yes    Comment: occasionally  . Drug use: No  . Sexual activity: Yes  Lifestyle  . Physical activity:    Days per week: Not on file    Minutes per session: Not on file  . Stress: Not on file  Relationships  . Social connections:    Talks on phone: Not on file    Gets together: Not on file    Attends religious service: Not on file    Active member of club or organization: Not on file    Attends meetings of clubs or  organizations: Not on file    Relationship status: Not on file  . Intimate partner violence:    Fear of current or ex partner: Not on file    Emotionally abused: Not on file    Physically abused: Not on file    Forced sexual activity: Not on file  Other Topics Concern  . Not on file  Social History Narrative  . Not on file    Environmental and Social history  Lives in a house with a dry environment, dogs located inside the household, no carpet in the bedroom, no plastic  on the bed, no plastic on the pillow, no smokers located inside the household.  She works in an office setting.  Objective:   Vitals:   04/08/18 1343  BP: (!) 144/82  Pulse: 68  Resp: 16  Temp: 98.6 F (37 C)   Height: 5' 5.7" (166.9 cm) Weight: 202 lb 6.4 oz (91.8 kg)  Physical Exam  HENT:  Head: Normocephalic. Head is without right periorbital erythema and without left periorbital erythema.  Right Ear: Tympanic membrane, external ear and ear canal normal.  Left Ear: Tympanic membrane, external ear and ear canal normal.  Nose: Nose normal. No mucosal edema or rhinorrhea.  Mouth/Throat: Uvula is midline, oropharynx is clear and moist and mucous membranes are normal. No oropharyngeal exudate.  Eyes: Pupils are equal, round, and reactive to light. Conjunctivae and lids are normal.  Neck: Trachea normal. No tracheal tenderness present. No tracheal deviation present. No thyromegaly present.  Cardiovascular: Normal rate, regular rhythm, S1 normal, S2 normal and normal heart sounds.  No murmur heard. Pulmonary/Chest: Effort normal and breath sounds normal. No stridor. No respiratory distress. She has no wheezes. She has no rales. She exhibits no tenderness.  Abdominal: Soft. She exhibits no distension and no mass. There is no hepatosplenomegaly. There is no tenderness. There is no rebound and no guarding.  Musculoskeletal: She exhibits no edema or tenderness.  Lymphadenopathy:       Head (right side): No  tonsillar adenopathy present.       Head (left side): No tonsillar adenopathy present.    She has no cervical adenopathy.    She has no axillary adenopathy.  Neurological: She is alert.  Skin: No rash noted. She is not diaphoretic. No erythema. No pallor. Nails show no clubbing.    Diagnostics: Allergy skin tests were performed.  She demonstrated hypersensitivity to grasses, weeds, trees, house dust mite, cat, dog, and horse  Assessment and Plan:    1. Seasonal allergic rhinitis due to pollen   2. Seasonal allergic conjunctivitis   3. Headache disorder   4. Food allergy     1.  Allergen avoidance measures  2.  Treat and prevent inflammation:   A.  OTC Nasacort 1 spray each nostril 3-7 times a week  3.  Treat and prevent headache:   A.  Consolidate caffeine slowly aiming for none  4.  If needed:   A.  OTC antihistamine  B. Azelastine nasal spray  C.  Nasal saline spray  D.  Pataday 1 drop each eye 1 time per day  5.  Further evaluation for nut allergy: Nut panel with reflex  6.  Return to clinic in 8 weeks or earlier if problem  7.  Obtain fall flu vaccine  [name] has rather significant atopic disease giving rise to upper airway issues and we will get her to perform allergen avoidance measures as best as possible and use a nasal steroid on a pretty consistent basis preventatively.  As well, she has rather significant headaches and although there may be an atopic trigger I suspect that caffeine use is also contributing to this issue and we will get her to consolidate her caffeine consumption over the course of the next month or so.  If she fails medical therapy she would be a candidate for immunotherapy.  In addition, we will work through the possibility of a nut allergy by checking and not panel with component reflex and make a determination about further evaluation and treatment for possible food allergy based upon  the results of this test.  Gwendolyn PriestEric J. Misael Mcgaha, MD Allergy /  Immunology [facility] [place]

## 2018-04-12 ENCOUNTER — Encounter: Payer: Self-pay | Admitting: Allergy and Immunology

## 2018-04-27 ENCOUNTER — Telehealth: Payer: Self-pay | Admitting: *Deleted

## 2018-04-27 NOTE — Telephone Encounter (Signed)
Called patient and advised her per Dr Lucie Leather reviewed lab tests from [facility] that showed elevated hazelnut and he believes this caused the allergic reaction she had and should avoid same

## 2018-04-28 ENCOUNTER — Encounter: Payer: Self-pay | Admitting: Allergy and Immunology

## 2018-05-24 ENCOUNTER — Encounter: Payer: Self-pay | Admitting: Allergy and Immunology

## 2018-05-24 ENCOUNTER — Ambulatory Visit: Payer: BC Managed Care – PPO | Admitting: Allergy and Immunology

## 2018-05-24 VITALS — BP 118/70 | HR 78 | Resp 18

## 2018-05-24 DIAGNOSIS — Z91018 Allergy to other foods: Secondary | ICD-10-CM

## 2018-05-24 DIAGNOSIS — R51 Headache: Secondary | ICD-10-CM | POA: Diagnosis not present

## 2018-05-24 DIAGNOSIS — H1010 Acute atopic conjunctivitis, unspecified eye: Secondary | ICD-10-CM | POA: Diagnosis not present

## 2018-05-24 DIAGNOSIS — J301 Allergic rhinitis due to pollen: Secondary | ICD-10-CM

## 2018-05-24 DIAGNOSIS — R519 Headache, unspecified: Secondary | ICD-10-CM

## 2018-05-24 MED ORDER — EPINEPHRINE 0.3 MG/0.3ML IJ SOAJ: 0.3000 mg | Freq: Once | INTRAMUSCULAR | 1 refills | Status: AC

## 2018-05-24 NOTE — Progress Notes (Signed)
Follow-up Note  Referring Provider: Rhea Bleacher* Primary Provider: Krystal Clark, NP Date of Office Visit: 05/24/2018  Subjective:   Gwendolyn Burnett (DOB: 16-Mar-1956) is a 62 y.o. female who [facility] in re-evaluation of the following:  HPI: Gwendolyn Burnett presents to this clinic in reevaluation of her allergic rhinoconjunctivitis and headache disorder and food allergy.  I last saw her in this clinic during initial evaluation a 08 April 2018.  She has performed allergen avoidance measures as best as possible.  She has casings on the bedding and she throws some of the bedding in the dryer every week.  She has consolidated her coffee consumption to 1 coffee per day in the morning.  She has some improvement while utilizing an occasional nasal steroid and performing allergen avoidance measures regarding her airway issue.  She still has some problems with headaches even with consolidating her caffeine.  She has been avoiding all tree nuts and peanuts since her allergic event that occurred early August after consuming a breakfast bar and almonds.  Allergies as of 05/24/2018      Reactions   Sulfamethoxazole Itching      Medication List      ALPRAZolam 0.5 MG tablet Commonly known as:  XANAX Take 0.25 mg by mouth 2 (two) times daily as needed for anxiety or sleep (never takes a whole tablet).   azelastine 0.1 % nasal spray Commonly known as:  ASTELIN Place 1 spray into both nostrils 2 (two) times daily. Use in each nostril as directed   ciprofloxacin 250 MG tablet Commonly known as:  CIPRO Take 250 mg by mouth daily as needed (ater intercourse for prevention of UTI).   fexofenadine 180 MG tablet Commonly known as:  ALLEGRA Take 180 mg by mouth every evening.   losartan 50 MG tablet Commonly known as:  COZAAR Take 50 mg by mouth daily.   Olopatadine HCl 0.2 % Soln Use one drop in each eye once daily if needed  for itchy, watery eyes.   TURMERIC PO Take by mouth.   Vitamin D-3 5000 units Tabs Take 5,000 Units by mouth every other day.       Past Medical History:  Diagnosis Date  . Anxiety   . Arthritis   . Frequent UTI   . Headache    migranes  . Hematuria   . History of UTI   . Hypertension   . Vaginal delivery    2x    Past Surgical History:  Procedure Laterality Date  . COLONOSCOPY    . 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 TOTAL HIP ARTHROPLASTY Right 01/26/2017   Procedure: RIGHT TOTAL HIP ARTHROPLASTY ANTERIOR APPROACH;  Surgeon: Samson Frederic, MD;  Location: [facility];  Service: Orthopedics;  Laterality: Right;  Needs RNFA    Review of systems negative except as noted in HPI / PMHx or noted below:  Review of Systems  Constitutional: Negative.   HENT: Negative.   Eyes: Negative.   Respiratory: Negative.   Cardiovascular: Negative.   Gastrointestinal: Negative.   Genitourinary: Negative.   Musculoskeletal: Negative.   Skin: Negative.   Neurological: Negative.   Endo/Heme/Allergies: Negative.   Psychiatric/Behavioral: Negative.      Objective:   Vitals:   05/24/18 1538  BP: 118/70  Pulse: 78  Resp: 18  SpO2: 97%          Physical Exam  HENT:  Head: Normocephalic.  Right Ear: Tympanic  membrane, external ear and ear canal normal.  Left Ear: Tympanic membrane, external ear and ear canal normal.  Nose: Nose normal. No mucosal edema or rhinorrhea.  Mouth/Throat: Uvula is midline, oropharynx is clear and moist and mucous membranes are normal. No oropharyngeal exudate.  Eyes: Conjunctivae are normal.  Neck: Trachea normal. No tracheal tenderness present. No tracheal deviation present. No thyromegaly present.  Cardiovascular: Normal rate, regular rhythm, S1 normal, S2 normal and normal heart sounds.  No murmur heard. Pulmonary/Chest: Breath sounds normal. No stridor. No respiratory distress. She has no wheezes. She has no rales.  Musculoskeletal: She  exhibits no edema.  Lymphadenopathy:       Head (right side): No tonsillar adenopathy present.       Head (left side): No tonsillar adenopathy present.    She has no cervical adenopathy.  Neurological: She is alert.  Skin: No rash noted. She is not diaphoretic. No erythema. Nails show no clubbing.    Diagnostics: Review of blood tests obtained 12 April 2018 identified IgE antibodies directed against hazelnut at 0.52 KU/L with cor a 1 at 0.64 KU/L and peanut at 0.24 KU/L with negative Ara H1, Ara H2, Ara H3, and no IgE antibodies directed against almond.    Assessment and Plan:   1. Seasonal allergic rhinitis due to pollen   2. Seasonal allergic conjunctivitis   3. Headache disorder   4. Food allergy     1.  Continue to perform Allergen avoidance measures  2.  Continue to Treat and prevent inflammation:   A.  OTC Nasacort / Rhinocort 1 spray each nostril 3-7 times a week  3.  Continue to Treat and prevent headache:   A.  Consolidate caffeine slowly aiming for none  4.  If needed:   A.  OTC antihistamine  B. Azelastine nasal spray  C.  Nasal saline spray  D.  Pataday 1 drop each eye 1 time per day  E.  Auvi-Q, Benadryl, MD/ER evaluation for allergic reaction  5. Consider a course of immunotherapy  6. Obtain fall flu vaccine  7. Return to clinic in 1 year or earlier if problem  Gwendolyn Burnett should consider a course of immunotherapy given the fact that even in the face of performing allergen avoidance measures and using some anti-inflammatory agents for her respiratory tract she still continues to remain symptomatic.  I have encouraged her once again to consider consolidating all of her caffeine to address her headache issue.  Some of her food allergies are probably tied up with oral allergy syndrome as she does have a localized mouth issues when she consumes certain nuts and peanuts.  However, there has been documented urticaria on one occasion and thus she needs a injectable  epinephrine device for food hypersensitivity.  I will see her back in this clinic in 1 year or earlier if there is a problem.Laurette Schimke, MD Allergy / Immunology  [facility]

## 2018-05-24 NOTE — Patient Instructions (Addendum)
  1.  Continue to perform Allergen avoidance measures  2.  Continue to Treat and prevent inflammation:   A.  OTC Nasacort / Rhinocort 1 spray each nostril 3-7 times a week  3.  Continue to Treat and prevent headache:   A.  Consolidate caffeine slowly aiming for none  4.  If needed:   A.  OTC antihistamine  B. Azelastine nasal spray  C.  Nasal saline spray  D.  Pataday 1 drop each eye 1 time per day  E. Auvi-q 0.3, benadryl, MD/ER evaluation for allergic reaction  5. Consider a course of immunotherapy  6. Obtain fall flu vaccine  7. Return to clinic in 1 year or earlier if problem

## 2018-05-25 ENCOUNTER — Encounter: Payer: Self-pay | Admitting: Allergy and Immunology

## 2018-06-03 ENCOUNTER — Other Ambulatory Visit: Payer: Self-pay | Admitting: Allergy and Immunology

## 2018-06-03 DIAGNOSIS — J3089 Other allergic rhinitis: Secondary | ICD-10-CM | POA: Diagnosis not present

## 2018-06-03 NOTE — Progress Notes (Signed)
VIALS EXP 06-04-19 

## 2018-06-04 DIAGNOSIS — J301 Allergic rhinitis due to pollen: Secondary | ICD-10-CM

## 2018-06-24 ENCOUNTER — Ambulatory Visit (INDEPENDENT_AMBULATORY_CARE_PROVIDER_SITE_OTHER): Payer: BC Managed Care – PPO | Admitting: *Deleted

## 2018-06-24 DIAGNOSIS — J309 Allergic rhinitis, unspecified: Secondary | ICD-10-CM

## 2018-06-24 NOTE — Progress Notes (Signed)
Patient started allergy injections Blue 1/100,000 at 0.05 dosage each 1-Gras-tree-cat-dog 1-dmite and weed. Reviewed Schedule B 1-2 times weekly, hours, and side effects. Reviewed epipen use and when to use same patient already has epipen at home

## 2018-06-28 ENCOUNTER — Ambulatory Visit (INDEPENDENT_AMBULATORY_CARE_PROVIDER_SITE_OTHER): Payer: BC Managed Care – PPO | Admitting: *Deleted

## 2018-06-28 DIAGNOSIS — J309 Allergic rhinitis, unspecified: Secondary | ICD-10-CM | POA: Diagnosis not present

## 2018-07-02 ENCOUNTER — Ambulatory Visit (INDEPENDENT_AMBULATORY_CARE_PROVIDER_SITE_OTHER): Payer: BC Managed Care – PPO | Admitting: *Deleted

## 2018-07-02 DIAGNOSIS — J309 Allergic rhinitis, unspecified: Secondary | ICD-10-CM

## 2018-07-05 ENCOUNTER — Ambulatory Visit (INDEPENDENT_AMBULATORY_CARE_PROVIDER_SITE_OTHER): Payer: BC Managed Care – PPO | Admitting: *Deleted

## 2018-07-05 DIAGNOSIS — J309 Allergic rhinitis, unspecified: Secondary | ICD-10-CM

## 2018-07-09 ENCOUNTER — Ambulatory Visit (INDEPENDENT_AMBULATORY_CARE_PROVIDER_SITE_OTHER): Payer: BC Managed Care – PPO | Admitting: *Deleted

## 2018-07-09 DIAGNOSIS — J309 Allergic rhinitis, unspecified: Secondary | ICD-10-CM

## 2018-07-12 ENCOUNTER — Ambulatory Visit (INDEPENDENT_AMBULATORY_CARE_PROVIDER_SITE_OTHER): Payer: BC Managed Care – PPO

## 2018-07-12 DIAGNOSIS — J309 Allergic rhinitis, unspecified: Secondary | ICD-10-CM

## 2018-07-19 ENCOUNTER — Ambulatory Visit (INDEPENDENT_AMBULATORY_CARE_PROVIDER_SITE_OTHER): Payer: BC Managed Care – PPO | Admitting: *Deleted

## 2018-07-19 DIAGNOSIS — J309 Allergic rhinitis, unspecified: Secondary | ICD-10-CM | POA: Diagnosis not present

## 2018-07-23 ENCOUNTER — Ambulatory Visit (INDEPENDENT_AMBULATORY_CARE_PROVIDER_SITE_OTHER): Payer: BC Managed Care – PPO | Admitting: *Deleted

## 2018-07-23 DIAGNOSIS — J309 Allergic rhinitis, unspecified: Secondary | ICD-10-CM

## 2018-07-26 ENCOUNTER — Ambulatory Visit (INDEPENDENT_AMBULATORY_CARE_PROVIDER_SITE_OTHER): Payer: BC Managed Care – PPO | Admitting: *Deleted

## 2018-07-26 DIAGNOSIS — J309 Allergic rhinitis, unspecified: Secondary | ICD-10-CM | POA: Diagnosis not present

## 2018-07-30 ENCOUNTER — Ambulatory Visit (INDEPENDENT_AMBULATORY_CARE_PROVIDER_SITE_OTHER): Payer: BC Managed Care – PPO | Admitting: *Deleted

## 2018-07-30 DIAGNOSIS — J309 Allergic rhinitis, unspecified: Secondary | ICD-10-CM | POA: Diagnosis not present

## 2018-08-02 ENCOUNTER — Ambulatory Visit (INDEPENDENT_AMBULATORY_CARE_PROVIDER_SITE_OTHER): Payer: BC Managed Care – PPO | Admitting: *Deleted

## 2018-08-02 DIAGNOSIS — J309 Allergic rhinitis, unspecified: Secondary | ICD-10-CM

## 2018-08-09 ENCOUNTER — Ambulatory Visit (INDEPENDENT_AMBULATORY_CARE_PROVIDER_SITE_OTHER): Payer: BC Managed Care – PPO

## 2018-08-09 DIAGNOSIS — J309 Allergic rhinitis, unspecified: Secondary | ICD-10-CM | POA: Diagnosis not present

## 2018-08-13 ENCOUNTER — Ambulatory Visit (INDEPENDENT_AMBULATORY_CARE_PROVIDER_SITE_OTHER): Payer: BC Managed Care – PPO

## 2018-08-13 DIAGNOSIS — J309 Allergic rhinitis, unspecified: Secondary | ICD-10-CM | POA: Diagnosis not present

## 2018-08-19 ENCOUNTER — Ambulatory Visit (INDEPENDENT_AMBULATORY_CARE_PROVIDER_SITE_OTHER): Payer: BC Managed Care – PPO | Admitting: *Deleted

## 2018-08-19 DIAGNOSIS — J309 Allergic rhinitis, unspecified: Secondary | ICD-10-CM | POA: Diagnosis not present

## 2018-08-23 ENCOUNTER — Ambulatory Visit (INDEPENDENT_AMBULATORY_CARE_PROVIDER_SITE_OTHER): Payer: BC Managed Care – PPO | Admitting: *Deleted

## 2018-08-23 DIAGNOSIS — J309 Allergic rhinitis, unspecified: Secondary | ICD-10-CM | POA: Diagnosis not present

## 2018-08-26 ENCOUNTER — Ambulatory Visit (INDEPENDENT_AMBULATORY_CARE_PROVIDER_SITE_OTHER): Payer: BC Managed Care – PPO | Admitting: *Deleted

## 2018-08-26 DIAGNOSIS — J309 Allergic rhinitis, unspecified: Secondary | ICD-10-CM | POA: Diagnosis not present

## 2018-08-30 ENCOUNTER — Ambulatory Visit (INDEPENDENT_AMBULATORY_CARE_PROVIDER_SITE_OTHER): Payer: BC Managed Care – PPO

## 2018-08-30 DIAGNOSIS — J309 Allergic rhinitis, unspecified: Secondary | ICD-10-CM

## 2018-09-02 ENCOUNTER — Ambulatory Visit (INDEPENDENT_AMBULATORY_CARE_PROVIDER_SITE_OTHER): Payer: BC Managed Care – PPO | Admitting: *Deleted

## 2018-09-02 DIAGNOSIS — J309 Allergic rhinitis, unspecified: Secondary | ICD-10-CM

## 2018-09-06 ENCOUNTER — Ambulatory Visit (INDEPENDENT_AMBULATORY_CARE_PROVIDER_SITE_OTHER): Payer: BC Managed Care – PPO

## 2018-09-06 DIAGNOSIS — J309 Allergic rhinitis, unspecified: Secondary | ICD-10-CM

## 2018-09-09 ENCOUNTER — Ambulatory Visit (INDEPENDENT_AMBULATORY_CARE_PROVIDER_SITE_OTHER): Payer: BC Managed Care – PPO | Admitting: *Deleted

## 2018-09-09 DIAGNOSIS — J309 Allergic rhinitis, unspecified: Secondary | ICD-10-CM

## 2018-09-13 ENCOUNTER — Ambulatory Visit (INDEPENDENT_AMBULATORY_CARE_PROVIDER_SITE_OTHER): Payer: BC Managed Care – PPO

## 2018-09-13 DIAGNOSIS — J309 Allergic rhinitis, unspecified: Secondary | ICD-10-CM

## 2018-09-17 ENCOUNTER — Ambulatory Visit (INDEPENDENT_AMBULATORY_CARE_PROVIDER_SITE_OTHER): Payer: BC Managed Care – PPO | Admitting: *Deleted

## 2018-09-17 DIAGNOSIS — J309 Allergic rhinitis, unspecified: Secondary | ICD-10-CM | POA: Diagnosis not present

## 2018-09-24 ENCOUNTER — Ambulatory Visit (INDEPENDENT_AMBULATORY_CARE_PROVIDER_SITE_OTHER): Payer: BC Managed Care – PPO | Admitting: *Deleted

## 2018-09-24 DIAGNOSIS — J309 Allergic rhinitis, unspecified: Secondary | ICD-10-CM | POA: Diagnosis not present

## 2018-09-27 ENCOUNTER — Ambulatory Visit (INDEPENDENT_AMBULATORY_CARE_PROVIDER_SITE_OTHER): Payer: BC Managed Care – PPO

## 2018-09-27 DIAGNOSIS — J309 Allergic rhinitis, unspecified: Secondary | ICD-10-CM | POA: Diagnosis not present

## 2018-10-07 ENCOUNTER — Ambulatory Visit (INDEPENDENT_AMBULATORY_CARE_PROVIDER_SITE_OTHER): Payer: BC Managed Care – PPO | Admitting: *Deleted

## 2018-10-07 DIAGNOSIS — J309 Allergic rhinitis, unspecified: Secondary | ICD-10-CM | POA: Diagnosis not present

## 2018-10-11 ENCOUNTER — Ambulatory Visit (INDEPENDENT_AMBULATORY_CARE_PROVIDER_SITE_OTHER): Payer: BC Managed Care – PPO | Admitting: *Deleted

## 2018-10-11 DIAGNOSIS — J309 Allergic rhinitis, unspecified: Secondary | ICD-10-CM

## 2018-10-18 ENCOUNTER — Ambulatory Visit (INDEPENDENT_AMBULATORY_CARE_PROVIDER_SITE_OTHER): Payer: BC Managed Care – PPO | Admitting: *Deleted

## 2018-10-18 DIAGNOSIS — J309 Allergic rhinitis, unspecified: Secondary | ICD-10-CM

## 2018-10-25 ENCOUNTER — Ambulatory Visit (INDEPENDENT_AMBULATORY_CARE_PROVIDER_SITE_OTHER): Payer: BC Managed Care – PPO | Admitting: *Deleted

## 2018-10-25 DIAGNOSIS — J309 Allergic rhinitis, unspecified: Secondary | ICD-10-CM

## 2018-11-01 ENCOUNTER — Ambulatory Visit (INDEPENDENT_AMBULATORY_CARE_PROVIDER_SITE_OTHER): Payer: BC Managed Care – PPO | Admitting: *Deleted

## 2018-11-01 DIAGNOSIS — J309 Allergic rhinitis, unspecified: Secondary | ICD-10-CM

## 2018-11-08 ENCOUNTER — Ambulatory Visit (INDEPENDENT_AMBULATORY_CARE_PROVIDER_SITE_OTHER): Payer: BC Managed Care – PPO

## 2018-11-08 DIAGNOSIS — J309 Allergic rhinitis, unspecified: Secondary | ICD-10-CM | POA: Diagnosis not present

## 2018-11-19 ENCOUNTER — Ambulatory Visit (INDEPENDENT_AMBULATORY_CARE_PROVIDER_SITE_OTHER): Payer: BC Managed Care – PPO | Admitting: *Deleted

## 2018-11-19 DIAGNOSIS — J309 Allergic rhinitis, unspecified: Secondary | ICD-10-CM

## 2018-11-29 ENCOUNTER — Ambulatory Visit (INDEPENDENT_AMBULATORY_CARE_PROVIDER_SITE_OTHER): Payer: BC Managed Care – PPO | Admitting: *Deleted

## 2018-11-29 DIAGNOSIS — J309 Allergic rhinitis, unspecified: Secondary | ICD-10-CM | POA: Diagnosis not present

## 2018-12-07 ENCOUNTER — Telehealth (INDEPENDENT_AMBULATORY_CARE_PROVIDER_SITE_OTHER): Payer: Self-pay

## 2018-12-07 NOTE — Telephone Encounter (Signed)
Received fax from [facility] stating that patient's authorization for  SynviscOne, left knee has been extended. [place] Approval # [id number] Valid 02/02/2018- 05/19/2019

## 2018-12-10 ENCOUNTER — Ambulatory Visit (INDEPENDENT_AMBULATORY_CARE_PROVIDER_SITE_OTHER): Payer: BC Managed Care – PPO | Admitting: *Deleted

## 2018-12-10 DIAGNOSIS — J309 Allergic rhinitis, unspecified: Secondary | ICD-10-CM | POA: Diagnosis not present

## 2018-12-21 NOTE — Progress Notes (Signed)
VIALS EXP 12-22-2019 

## 2018-12-22 DIAGNOSIS — J3089 Other allergic rhinitis: Secondary | ICD-10-CM

## 2018-12-23 ENCOUNTER — Ambulatory Visit (INDEPENDENT_AMBULATORY_CARE_PROVIDER_SITE_OTHER): Payer: BC Managed Care – PPO | Admitting: *Deleted

## 2018-12-23 DIAGNOSIS — J309 Allergic rhinitis, unspecified: Secondary | ICD-10-CM | POA: Diagnosis not present

## 2019-01-03 ENCOUNTER — Ambulatory Visit (INDEPENDENT_AMBULATORY_CARE_PROVIDER_SITE_OTHER): Payer: BC Managed Care – PPO | Admitting: *Deleted

## 2019-01-03 DIAGNOSIS — J309 Allergic rhinitis, unspecified: Secondary | ICD-10-CM

## 2019-01-14 ENCOUNTER — Ambulatory Visit (INDEPENDENT_AMBULATORY_CARE_PROVIDER_SITE_OTHER): Payer: BC Managed Care – PPO | Admitting: *Deleted

## 2019-01-14 DIAGNOSIS — J309 Allergic rhinitis, unspecified: Secondary | ICD-10-CM | POA: Diagnosis not present

## 2019-01-25 ENCOUNTER — Ambulatory Visit (INDEPENDENT_AMBULATORY_CARE_PROVIDER_SITE_OTHER): Payer: Medicare Other | Admitting: *Deleted

## 2019-01-25 DIAGNOSIS — J309 Allergic rhinitis, unspecified: Secondary | ICD-10-CM | POA: Diagnosis not present

## 2019-02-07 ENCOUNTER — Ambulatory Visit (INDEPENDENT_AMBULATORY_CARE_PROVIDER_SITE_OTHER): Payer: Medicare Other | Admitting: *Deleted

## 2019-02-07 DIAGNOSIS — J309 Allergic rhinitis, unspecified: Secondary | ICD-10-CM | POA: Diagnosis not present

## 2019-02-17 ENCOUNTER — Ambulatory Visit (INDEPENDENT_AMBULATORY_CARE_PROVIDER_SITE_OTHER): Payer: Medicare Other | Admitting: *Deleted

## 2019-02-17 DIAGNOSIS — J309 Allergic rhinitis, unspecified: Secondary | ICD-10-CM | POA: Diagnosis not present

## 2019-02-28 ENCOUNTER — Ambulatory Visit (INDEPENDENT_AMBULATORY_CARE_PROVIDER_SITE_OTHER): Payer: Medicare Other | Admitting: *Deleted

## 2019-02-28 DIAGNOSIS — J309 Allergic rhinitis, unspecified: Secondary | ICD-10-CM | POA: Diagnosis not present

## 2019-03-10 ENCOUNTER — Ambulatory Visit (INDEPENDENT_AMBULATORY_CARE_PROVIDER_SITE_OTHER): Payer: Medicare Other | Admitting: *Deleted

## 2019-03-10 DIAGNOSIS — J309 Allergic rhinitis, unspecified: Secondary | ICD-10-CM

## 2019-03-21 ENCOUNTER — Ambulatory Visit (INDEPENDENT_AMBULATORY_CARE_PROVIDER_SITE_OTHER): Payer: Medicare Other

## 2019-03-21 DIAGNOSIS — J309 Allergic rhinitis, unspecified: Secondary | ICD-10-CM

## 2019-04-05 ENCOUNTER — Ambulatory Visit (INDEPENDENT_AMBULATORY_CARE_PROVIDER_SITE_OTHER): Payer: Medicare Other | Admitting: *Deleted

## 2019-04-05 DIAGNOSIS — J309 Allergic rhinitis, unspecified: Secondary | ICD-10-CM

## 2019-04-18 ENCOUNTER — Ambulatory Visit (INDEPENDENT_AMBULATORY_CARE_PROVIDER_SITE_OTHER): Payer: Medicare Other | Admitting: *Deleted

## 2019-04-18 DIAGNOSIS — J309 Allergic rhinitis, unspecified: Secondary | ICD-10-CM

## 2019-04-26 DIAGNOSIS — J3089 Other allergic rhinitis: Secondary | ICD-10-CM | POA: Diagnosis not present

## 2019-04-26 NOTE — Progress Notes (Signed)
Vials exp 04-25-20 

## 2019-04-27 DIAGNOSIS — J301 Allergic rhinitis due to pollen: Secondary | ICD-10-CM | POA: Diagnosis not present

## 2019-05-02 ENCOUNTER — Ambulatory Visit (INDEPENDENT_AMBULATORY_CARE_PROVIDER_SITE_OTHER): Payer: Medicare Other | Admitting: *Deleted

## 2019-05-02 DIAGNOSIS — J309 Allergic rhinitis, unspecified: Secondary | ICD-10-CM

## 2019-05-12 ENCOUNTER — Ambulatory Visit (INDEPENDENT_AMBULATORY_CARE_PROVIDER_SITE_OTHER): Payer: Medicare Other | Admitting: *Deleted

## 2019-05-12 DIAGNOSIS — J309 Allergic rhinitis, unspecified: Secondary | ICD-10-CM

## 2019-05-23 ENCOUNTER — Ambulatory Visit (INDEPENDENT_AMBULATORY_CARE_PROVIDER_SITE_OTHER): Payer: Medicare Other | Admitting: *Deleted

## 2019-05-23 DIAGNOSIS — J309 Allergic rhinitis, unspecified: Secondary | ICD-10-CM

## 2019-05-31 ENCOUNTER — Ambulatory Visit (INDEPENDENT_AMBULATORY_CARE_PROVIDER_SITE_OTHER): Payer: Medicare Other | Admitting: *Deleted

## 2019-05-31 DIAGNOSIS — J309 Allergic rhinitis, unspecified: Secondary | ICD-10-CM

## 2019-06-09 ENCOUNTER — Ambulatory Visit (INDEPENDENT_AMBULATORY_CARE_PROVIDER_SITE_OTHER): Payer: Medicare Other

## 2019-06-09 DIAGNOSIS — J309 Allergic rhinitis, unspecified: Secondary | ICD-10-CM | POA: Diagnosis not present

## 2019-06-20 ENCOUNTER — Ambulatory Visit (INDEPENDENT_AMBULATORY_CARE_PROVIDER_SITE_OTHER): Payer: Medicare Other | Admitting: *Deleted

## 2019-06-20 DIAGNOSIS — J309 Allergic rhinitis, unspecified: Secondary | ICD-10-CM | POA: Diagnosis not present

## 2019-06-28 ENCOUNTER — Ambulatory Visit (INDEPENDENT_AMBULATORY_CARE_PROVIDER_SITE_OTHER): Payer: Medicare Other | Admitting: *Deleted

## 2019-06-28 DIAGNOSIS — J309 Allergic rhinitis, unspecified: Secondary | ICD-10-CM | POA: Diagnosis not present

## 2019-07-07 ENCOUNTER — Ambulatory Visit (INDEPENDENT_AMBULATORY_CARE_PROVIDER_SITE_OTHER): Payer: Medicare Other

## 2019-07-07 DIAGNOSIS — J309 Allergic rhinitis, unspecified: Secondary | ICD-10-CM

## 2019-07-26 ENCOUNTER — Ambulatory Visit (INDEPENDENT_AMBULATORY_CARE_PROVIDER_SITE_OTHER): Payer: Medicare Other | Admitting: *Deleted

## 2019-07-26 DIAGNOSIS — J309 Allergic rhinitis, unspecified: Secondary | ICD-10-CM

## 2019-08-04 ENCOUNTER — Ambulatory Visit (INDEPENDENT_AMBULATORY_CARE_PROVIDER_SITE_OTHER): Payer: Medicare Other

## 2019-08-04 DIAGNOSIS — J309 Allergic rhinitis, unspecified: Secondary | ICD-10-CM

## 2019-08-15 ENCOUNTER — Ambulatory Visit (INDEPENDENT_AMBULATORY_CARE_PROVIDER_SITE_OTHER): Payer: Medicare Other | Admitting: Allergy and Immunology

## 2019-08-15 ENCOUNTER — Other Ambulatory Visit: Payer: Self-pay

## 2019-08-15 ENCOUNTER — Encounter: Payer: Self-pay | Admitting: Allergy and Immunology

## 2019-08-15 VITALS — BP 142/80 | HR 76 | Temp 97.3°F | Resp 10 | Ht 65.8 in | Wt 194.0 lb

## 2019-08-15 DIAGNOSIS — R519 Headache, unspecified: Secondary | ICD-10-CM

## 2019-08-15 DIAGNOSIS — J3089 Other allergic rhinitis: Secondary | ICD-10-CM | POA: Diagnosis not present

## 2019-08-15 DIAGNOSIS — J309 Allergic rhinitis, unspecified: Secondary | ICD-10-CM | POA: Diagnosis not present

## 2019-08-15 DIAGNOSIS — J301 Allergic rhinitis due to pollen: Secondary | ICD-10-CM | POA: Diagnosis not present

## 2019-08-15 DIAGNOSIS — H1010 Acute atopic conjunctivitis, unspecified eye: Secondary | ICD-10-CM | POA: Diagnosis not present

## 2019-08-15 DIAGNOSIS — Z91018 Allergy to other foods: Secondary | ICD-10-CM

## 2019-08-15 MED ORDER — EPINEPHRINE 0.3 MG/0.3ML IJ SOAJ: INTRAMUSCULAR | 3 refills | Status: DC

## 2019-08-15 NOTE — Patient Instructions (Signed)
  1.  Continue to perform Allergen avoidance measures  2.  Continue to Treat and prevent inflammation:   A.  OTC Nasacort  1 spray each nostril 3-7 times a week  3.  Continue to Treat and prevent headache:   A.  Minimize caffeine consumption  4. Continue immunotherapy  5.  If needed:   A.  OTC antihistamine  B. Azelastine nasal spray  C.  Nasal saline spray  D.  Pataday 1 drop each eye 1 time per day  E.  Auvi-q 0.3 / Epi-Pen, benadryl, MD/ER evaluation for allergic reaction  6. Obtain covid vaccine when available  7. Return to clinic in 1 year or earlier if problem

## 2019-08-15 NOTE — Progress Notes (Signed)
[facility] [place] [facility] [place]   Follow-up Note  Referring Provider: Bess Harvest* Primary Provider: Mayer Camel, NP Date of Office Visit: 08/15/2019  Subjective:   Gwendolyn Burnett (DOB: [birth year]) is a 63 y.o. female who returns to the [facility] on 08/15/2019 in re-evaluation of the following:  HPI: Gwendolyn Burnett returns to this clinic in reevaluation of allergic rhinoconjunctivitis and food allergy directed against tree nuts and peanuts and fish and headache disorder.  Her last visit to this clinic was 24 May 2018.  She started immunotherapy November 2019 and this has resulted in significant improvement regarding her allergies.  It does not sound as though she has required a systemic steroid or antibiotic for an airway issue.  She continues to use a nasal steroid on a consistent basis.  Currently her immunotherapy is every 2 weeks and she has been receiving this therapy without adverse effect except for on 2 occasions.  With one occasion she developed some very mild tongue swelling after 30 minutes of observation time that did not require any therapy.  On another occasion she developed diffuse urticaria that was treated with Benadryl.  She remains away from tree nuts and peanuts.  Her headaches have improved significantly since she has modified her caffeine consumption.  She only drinks 1 caffeinated drink per day at this point and does not really have an issue with headaches.  She did receive the flu vaccine this year.  Allergies as of 08/15/2019      Reactions   Sulfamethoxazole Itching      Medication List    ALPRAZolam 0.5 MG tablet Commonly known as: XANAX Take 0.25 mg by mouth 2 (two) times daily as needed for anxiety or sleep (never takes a whole tablet).   atorvastatin 10 MG tablet Commonly known as: LIPITOR Take 10 mg by mouth daily.   azelastine 0.1 % nasal spray Commonly known as: ASTELIN Place 1  spray into both nostrils 2 (two) times daily. Use in each nostril as directed   ciprofloxacin 250 MG tablet Commonly known as: CIPRO Take 250 mg by mouth daily as needed (ater intercourse for prevention of UTI).   EPINEPHrine 0.3 mg/0.3 mL Soaj injection Commonly known as: EpiPen 2-Pak Use as directed for life-threatening allergic reaction. Started by: Jiles Prows, MD   fexofenadine 180 MG tablet Commonly known as: ALLEGRA Take 180 mg by mouth every evening.   losartan 50 MG tablet Commonly known as: COZAAR Take 50 mg by mouth daily.   Nasacort Allergy 24HR 55 MCG/ACT Aero nasal inhaler Generic drug: triamcinolone Place 2 sprays into the nose daily.   Olopatadine HCl 0.2 % Soln Commonly known as: Pataday Use one drop in each eye once daily if needed for itchy, watery eyes.   PROBIOTIC DAILY PO Take by mouth.   VITAMIN C PO Take by mouth.   Vitamin D-3 125 MCG (5000 UT) Tabs Take 5,000 Units by mouth every other day.   ZINC PO Take by mouth.       Past Medical History:  Diagnosis Date  . Anxiety   . Arthritis   . Frequent UTI   . Headache    migranes  . Hematuria   . History of UTI   . Hypertension   . Vaginal delivery    2x    Past Surgical History:  Procedure Laterality Date  . COLONOSCOPY    . 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 TOTAL  HIP ARTHROPLASTY Right 01/26/2017   Procedure: RIGHT TOTAL HIP ARTHROPLASTY ANTERIOR APPROACH;  Surgeon: Samson FredericSwinteck, Brian, MD;  Location: [facility];  Service: Orthopedics;  Laterality: Right;  Needs RNFA    Review of systems negative except as noted in HPI / PMHx or noted below:  Review of Systems  Constitutional: Negative.   HENT: Negative.   Eyes: Negative.   Respiratory: Negative.   Cardiovascular: Negative.   Gastrointestinal: Negative.   Genitourinary: Negative.   Musculoskeletal: Negative.   Skin: Negative.   Neurological: Negative.   Endo/Heme/Allergies: Negative.   Psychiatric/Behavioral:  Negative.      Objective:   Vitals:   08/15/19 1414  BP: (!) 142/80  Pulse: 76  Resp: 10  Temp: (!) 97.3 F (36.3 C)  SpO2: 97%   Height: 5' 5.8" (167.1 cm)  Weight: 194 lb (88 kg)   Physical Exam Constitutional:      Appearance: She is not diaphoretic.  HENT:     Head: Normocephalic.     Right Ear: Tympanic membrane, ear canal and external ear normal.     Left Ear: Tympanic membrane, ear canal and external ear normal.     Nose: Nose normal. No mucosal edema or rhinorrhea.     Mouth/Throat:     Pharynx: Uvula midline. No oropharyngeal exudate.  Eyes:     Conjunctiva/sclera: Conjunctivae normal.  Neck:     Thyroid: No thyromegaly.     Trachea: Trachea normal. No tracheal tenderness or tracheal deviation.  Cardiovascular:     Rate and Rhythm: Normal rate and regular rhythm.     Heart sounds: Normal heart sounds, S1 normal and S2 normal. No murmur.  Pulmonary:     Effort: No respiratory distress.     Breath sounds: Normal breath sounds. No stridor. No wheezing or rales.  Lymphadenopathy:     Head:     Right side of head: No tonsillar adenopathy.     Left side of head: No tonsillar adenopathy.     Cervical: No cervical adenopathy.  Skin:    Findings: No erythema or rash.     Nails: There is no clubbing.  Neurological:     Mental Status: She is alert.     Diagnostics: none   Assessment and Plan:   1. Allergic rhinitis, unspecified seasonality, unspecified trigger   2. Other allergic rhinitis   3. Seasonal allergic rhinitis due to pollen   4. Seasonal allergic conjunctivitis   5. Headache disorder   6. Food allergy     1.  Continue to perform Allergen avoidance measures  2.  Continue to Treat and prevent inflammation:   A.  OTC Nasacort  1 spray each nostril 3-7 times a week  3.  Continue to Treat and prevent headache:   A.  Minimize caffeine consumption  4. Continue immunotherapy  5.  If needed:   A.  OTC antihistamine  B. Azelastine nasal  spray  C.  Nasal saline spray  D.  Pataday 1 drop each eye 1 time per day  E.  Auvi-q 0.3 / Epi-Pen, benadryl, MD/ER evaluation for allergic reaction  6. Obtain covid vaccine when available  7. Return to clinic in 1 year or earlier if problem  Gwendolyn Burnett appears to be doing very well on her current therapy.  She appears to have acquired benefit from the use of her immunotherapy.  She will be starting a every 2-week administration interval sometime in the next month.  Assuming she continues to do well with the  plan noted above I will see her back in his clinic in 1 year or earlier if there is a problem.  Laurette Schimke, MD Allergy / Immunology [facility]

## 2019-08-16 ENCOUNTER — Encounter: Payer: Self-pay | Admitting: Allergy and Immunology

## 2019-08-22 ENCOUNTER — Ambulatory Visit (INDEPENDENT_AMBULATORY_CARE_PROVIDER_SITE_OTHER): Payer: Medicare PPO | Admitting: *Deleted

## 2019-08-22 DIAGNOSIS — J309 Allergic rhinitis, unspecified: Secondary | ICD-10-CM | POA: Diagnosis not present

## 2019-09-06 ENCOUNTER — Ambulatory Visit (INDEPENDENT_AMBULATORY_CARE_PROVIDER_SITE_OTHER): Payer: Medicare PPO

## 2019-09-06 DIAGNOSIS — J309 Allergic rhinitis, unspecified: Secondary | ICD-10-CM

## 2019-09-19 ENCOUNTER — Ambulatory Visit (INDEPENDENT_AMBULATORY_CARE_PROVIDER_SITE_OTHER): Payer: Medicare PPO | Admitting: *Deleted

## 2019-09-19 DIAGNOSIS — J309 Allergic rhinitis, unspecified: Secondary | ICD-10-CM

## 2019-09-27 DIAGNOSIS — J3089 Other allergic rhinitis: Secondary | ICD-10-CM

## 2019-09-28 DIAGNOSIS — J301 Allergic rhinitis due to pollen: Secondary | ICD-10-CM

## 2019-10-03 ENCOUNTER — Ambulatory Visit (INDEPENDENT_AMBULATORY_CARE_PROVIDER_SITE_OTHER): Payer: Medicare PPO | Admitting: *Deleted

## 2019-10-03 DIAGNOSIS — J309 Allergic rhinitis, unspecified: Secondary | ICD-10-CM

## 2019-10-17 ENCOUNTER — Ambulatory Visit (INDEPENDENT_AMBULATORY_CARE_PROVIDER_SITE_OTHER): Payer: Medicare PPO | Admitting: *Deleted

## 2019-10-17 DIAGNOSIS — J309 Allergic rhinitis, unspecified: Secondary | ICD-10-CM | POA: Diagnosis not present

## 2019-11-02 ENCOUNTER — Ambulatory Visit (INDEPENDENT_AMBULATORY_CARE_PROVIDER_SITE_OTHER): Payer: Medicare PPO | Admitting: *Deleted

## 2019-11-02 DIAGNOSIS — J309 Allergic rhinitis, unspecified: Secondary | ICD-10-CM

## 2019-11-10 ENCOUNTER — Ambulatory Visit (INDEPENDENT_AMBULATORY_CARE_PROVIDER_SITE_OTHER): Payer: Medicare PPO | Admitting: *Deleted

## 2019-11-10 DIAGNOSIS — J309 Allergic rhinitis, unspecified: Secondary | ICD-10-CM

## 2019-11-16 ENCOUNTER — Ambulatory Visit (INDEPENDENT_AMBULATORY_CARE_PROVIDER_SITE_OTHER): Payer: Medicare PPO | Admitting: *Deleted

## 2019-11-16 DIAGNOSIS — J309 Allergic rhinitis, unspecified: Secondary | ICD-10-CM | POA: Diagnosis not present

## 2019-11-23 ENCOUNTER — Ambulatory Visit (INDEPENDENT_AMBULATORY_CARE_PROVIDER_SITE_OTHER): Payer: Medicare PPO | Admitting: *Deleted

## 2019-11-23 DIAGNOSIS — J309 Allergic rhinitis, unspecified: Secondary | ICD-10-CM

## 2019-12-02 ENCOUNTER — Ambulatory Visit (INDEPENDENT_AMBULATORY_CARE_PROVIDER_SITE_OTHER): Payer: Medicare PPO | Admitting: *Deleted

## 2019-12-02 DIAGNOSIS — J309 Allergic rhinitis, unspecified: Secondary | ICD-10-CM | POA: Diagnosis not present

## 2019-12-15 ENCOUNTER — Ambulatory Visit (INDEPENDENT_AMBULATORY_CARE_PROVIDER_SITE_OTHER): Payer: Medicare PPO | Admitting: *Deleted

## 2019-12-15 DIAGNOSIS — J309 Allergic rhinitis, unspecified: Secondary | ICD-10-CM | POA: Diagnosis not present

## 2019-12-19 ENCOUNTER — Other Ambulatory Visit: Payer: Self-pay

## 2019-12-19 ENCOUNTER — Encounter: Payer: Self-pay | Admitting: Allergy and Immunology

## 2019-12-19 ENCOUNTER — Ambulatory Visit (INDEPENDENT_AMBULATORY_CARE_PROVIDER_SITE_OTHER): Payer: Medicare PPO | Admitting: Allergy and Immunology

## 2019-12-19 VITALS — BP 140/90 | HR 72 | Temp 98.4°F | Resp 10

## 2019-12-19 DIAGNOSIS — IMO0001 Reserved for inherently not codable concepts without codable children: Secondary | ICD-10-CM

## 2019-12-19 DIAGNOSIS — R519 Headache, unspecified: Secondary | ICD-10-CM | POA: Diagnosis not present

## 2019-12-19 DIAGNOSIS — H1010 Acute atopic conjunctivitis, unspecified eye: Secondary | ICD-10-CM | POA: Diagnosis not present

## 2019-12-19 DIAGNOSIS — J301 Allergic rhinitis due to pollen: Secondary | ICD-10-CM | POA: Diagnosis not present

## 2019-12-19 DIAGNOSIS — Z91018 Allergy to other foods: Secondary | ICD-10-CM

## 2019-12-19 DIAGNOSIS — T63441A Toxic effect of venom of bees, accidental (unintentional), initial encounter: Secondary | ICD-10-CM

## 2019-12-19 NOTE — Patient Instructions (Signed)
  1.  Continue to perform Allergen avoidance measures  2.  Continue to Treat and prevent inflammation:   A.  OTC Nasacort  1 spray each nostril 3-7 times a week  3. Continue immunotherapy  5.  If needed:   A.  OTC antihistamine  B.  Nasal saline spray  C.  Pataday 1 drop each eye 1 time per day  D.  Auvi-q 0.3 / Epi-Pen, benadryl, MD/ER evaluation   6. Obtain blood - Fire ant IgE  7. Return to clinic in 1 year or earlier if problem

## 2019-12-19 NOTE — Progress Notes (Signed)
[facility] [place]   Follow-up Note  Referring Provider: Bess Harvest* Primary Provider: Mayer Camel, NP Date of Office Visit: 12/19/2019  Subjective:   Gwendolyn Burnett (DOB: 1956-01-24) is a 64 y.o. 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  Her last visit to this clinic was 15 August 2019.  She continues with immunotherapy currently at every 2 weeks without any adverse effect.  This therapy has resulted in improvement regarding her atopic respiratory and eye condition.  During the very heavy pollination spring she did have a little bit more problem with a slight spring flare with some nasal congestion but really no eye issues while she consistently uses Nasacort and Allegra.  Sometimes she will take a Benadryl.  She remains away from eating all tree nuts and peanuts.  Her headache disorder is much improved since she is very careful about caffeine consumption.  She had some type of reaction this past weekend.  She is working in the garden and she thinks that she was stung by some ants although she did not see the ants.  Then she had a few mosquitoes flying around her head and she thought she might have been bit by mosquitoes.  She developed 3 lesions that were red and itchy and swollen on the top of her head and then she developed one on her neck and one on her arm.  She took some Benadryl and the roof of her mouth felt a little bit funny for a few minutes but within another hour she developed very significant periorbital swelling where her eyes were almost shut.  She took 20 mg of prednisone and lay down on the couch with ice packs on her eyes and she was much better within a short period in time and by  the time she went to bed her eyes were much less swollen.  She had no associated systemic or constitutional symptoms with this issue.  It should be noted that she did take amoxicillin for prophylactic dental procedure performed 3 days prior to this reaction.  She has received 2 Covid vaccinations.  Allergies as of 12/19/2019      Reactions   Sulfamethoxazole Itching      Medication List      ALPRAZolam 0.5 MG tablet Commonly known as: XANAX Take 0.25 mg by mouth 2 (two) times daily as needed for anxiety or sleep (never takes a whole tablet).   atorvastatin 10 MG tablet Commonly known as: LIPITOR Take 10 mg by mouth daily.   azelastine 0.1 % nasal spray Commonly known as: ASTELIN Place 1 spray into both nostrils 2 (two) times daily. Use in each nostril as directed   ciprofloxacin 250 MG tablet Commonly known as: CIPRO Take 250 mg by mouth daily as needed (ater intercourse for prevention of UTI).   EPINEPHrine 0.3 mg/0.3 mL Soaj injection Commonly known as: EpiPen 2-Pak Use as directed for life-threatening allergic reaction.   fexofenadine 180 MG tablet Commonly known as: ALLEGRA Take 180 mg by mouth every evening.   losartan 50 MG tablet Commonly known as: COZAAR Take 50 mg by mouth daily.   Nasacort Allergy 24HR 55 MCG/ACT Aero nasal inhaler Generic drug: triamcinolone Place 2 sprays into the nose daily.   Olopatadine HCl  0.2 % Soln Commonly known as: Pataday Use one drop in each eye once daily if needed for itchy, watery eyes.   PROBIOTIC DAILY PO Take by mouth.   VITAMIN C PO Take by mouth.   Vitamin D-3 125 MCG (5000 UT) Tabs Take 5,000 Units by mouth every other day.   ZINC PO Take by mouth.       Past Medical History:  Diagnosis Date  . Anxiety   . Arthritis   . Frequent UTI   . Headache    migranes  . Hematuria   . History of UTI   . Hypertension   . Vaginal delivery    2x    Past Surgical History:  Procedure Laterality Date  .  COLONOSCOPY    . 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 TOTAL HIP ARTHROPLASTY Right 01/26/2017   Procedure: RIGHT TOTAL HIP ARTHROPLASTY ANTERIOR APPROACH;  Surgeon: Samson Frederic, MD;  Location: [facility];  Service: Orthopedics;  Laterality: Right;  Needs RNFA    Review of systems negative except as noted in HPI / PMHx or noted below:  Review of Systems  Constitutional: Negative.   HENT: Negative.   Eyes: Negative.   Respiratory: Negative.   Cardiovascular: Negative.   Gastrointestinal: Negative.   Genitourinary: Negative.   Musculoskeletal: Negative.   Skin: Negative.   Neurological: Negative.   Endo/Heme/Allergies: Negative.   Psychiatric/Behavioral: Negative.      Objective:   Vitals:   12/19/19 1553  BP: 140/90  Pulse: 72  Resp: 10  Temp: 98.4 F (36.9 C)          Physical Exam Constitutional:      Appearance: She is not diaphoretic.  HENT:     Head: Normocephalic.     Right Ear: Tympanic membrane, ear canal and external ear normal.     Left Ear: Tympanic membrane, ear canal and external ear normal.     Nose: Nose normal. No mucosal edema or rhinorrhea.     Mouth/Throat:     Pharynx: Uvula midline. No oropharyngeal exudate.  Eyes:     Conjunctiva/sclera: Conjunctivae normal.  Neck:     Thyroid: No thyromegaly.     Trachea: Trachea normal. No tracheal tenderness or tracheal deviation.  Cardiovascular:     Rate and Rhythm: Normal rate and regular rhythm.     Heart sounds: Normal heart sounds, S1 normal and S2 normal. No murmur.  Pulmonary:     Effort: No respiratory distress.     Breath sounds: Normal breath sounds. No stridor. No wheezing or rales.  Lymphadenopathy:     Head:     Right side of head: No tonsillar adenopathy.     Left side of head: No tonsillar adenopathy.     Cervical: No cervical adenopathy.  Skin:    Findings: No erythema or rash.     Nails: There is no clubbing.  Neurological:     Mental Status: She is alert.      Diagnostics: none  Assessment and Plan:   1. Seasonal allergic rhinitis due to pollen   2. Seasonal allergic conjunctivitis   3. Headache disorder   4. Food allergy   5. Hymenoptera reaction, accidental or unintentional, initial encounter     1.  Continue to perform Allergen avoidance measures  2.  Continue to Treat and prevent inflammation:   A.  OTC Nasacort  1 spray each nostril 3-7 times a week  3. Continue immunotherapy  5.  If needed:   A.  OTC antihistamine  B.  Nasal saline spray  C.  Pataday 1 drop each eye 1 time per day  D.  Auvi-q 0.3 / Epi-Pen, benadryl, MD/ER evaluation   6. Obtain blood - Fire ant IgE  7. Return to clinic in 1 year or earlier if problem  Gwendolyn Burnett appears to be doing okay with her classic atopic disease while using immunotherapy and some anti-inflammatory agents for her airway and and an antihistamine.  As well, she will remain away from eating peanuts and tree nuts.  It sounds as though she had a systemic allergic reaction this week and this was following a fire ant sting.  We will evaluate the titer of her IgE antibodies directed against fire ant and depending on that titer make a decision about further evaluation and treatment.  If she does not have a high titer of IgE antibodies directed against fire ant venom we need to consider the possibility that she had a delayed reaction to amoxicillin.  Laurette Schimke, MD Allergy / Immunology [facility]

## 2019-12-20 ENCOUNTER — Encounter: Payer: Self-pay | Admitting: Allergy and Immunology

## 2019-12-24 LAB — ALLERGEN FIRE ANT: I070-IgE Fire Ant (Invicta): 5.59 kU/L — AB

## 2019-12-28 ENCOUNTER — Other Ambulatory Visit: Payer: Self-pay | Admitting: Allergy and Immunology

## 2019-12-28 DIAGNOSIS — IMO0001 Reserved for inherently not codable concepts without codable children: Secondary | ICD-10-CM

## 2019-12-28 DIAGNOSIS — T63421D Toxic effect of venom of ants, accidental (unintentional), subsequent encounter: Secondary | ICD-10-CM

## 2019-12-28 NOTE — Progress Notes (Signed)
VIALS EXP 12-27-20 

## 2019-12-30 ENCOUNTER — Ambulatory Visit (INDEPENDENT_AMBULATORY_CARE_PROVIDER_SITE_OTHER): Payer: Medicare PPO

## 2019-12-30 DIAGNOSIS — J309 Allergic rhinitis, unspecified: Secondary | ICD-10-CM

## 2020-01-03 NOTE — Progress Notes (Signed)
VIALS EXP 01-02-21 

## 2020-01-04 DIAGNOSIS — J3089 Other allergic rhinitis: Secondary | ICD-10-CM | POA: Diagnosis not present

## 2020-01-05 DIAGNOSIS — J301 Allergic rhinitis due to pollen: Secondary | ICD-10-CM

## 2020-01-13 ENCOUNTER — Ambulatory Visit (INDEPENDENT_AMBULATORY_CARE_PROVIDER_SITE_OTHER): Payer: Medicare PPO

## 2020-01-13 DIAGNOSIS — J309 Allergic rhinitis, unspecified: Secondary | ICD-10-CM | POA: Diagnosis not present

## 2020-01-19 ENCOUNTER — Other Ambulatory Visit: Payer: Self-pay

## 2020-01-19 ENCOUNTER — Ambulatory Visit (INDEPENDENT_AMBULATORY_CARE_PROVIDER_SITE_OTHER): Payer: Medicare PPO | Admitting: *Deleted

## 2020-01-19 DIAGNOSIS — T63421A Toxic effect of venom of ants, accidental (unintentional), initial encounter: Secondary | ICD-10-CM | POA: Diagnosis not present

## 2020-01-19 DIAGNOSIS — IMO0001 Reserved for inherently not codable concepts without codable children: Secondary | ICD-10-CM

## 2020-01-19 NOTE — Progress Notes (Signed)
Immunotherapy   Patient Details  Name: Gwendolyn Burnett MRN: [id number] Date of Birth: August 06, 1956  01/19/2020  [name] - FIRE ANT 12/27/2020 Following schedule:  B Frequency: 1-2 TIMES WEEKLY Epi-Pen: YES Consent signed and patient instructions given.   Redge Gainer 01/19/2020, 10:41 AM

## 2020-01-30 ENCOUNTER — Ambulatory Visit (INDEPENDENT_AMBULATORY_CARE_PROVIDER_SITE_OTHER): Payer: Medicare PPO

## 2020-01-30 DIAGNOSIS — T63421A Toxic effect of venom of ants, accidental (unintentional), initial encounter: Secondary | ICD-10-CM | POA: Diagnosis not present

## 2020-01-30 DIAGNOSIS — IMO0001 Reserved for inherently not codable concepts without codable children: Secondary | ICD-10-CM

## 2020-02-01 ENCOUNTER — Ambulatory Visit (INDEPENDENT_AMBULATORY_CARE_PROVIDER_SITE_OTHER): Payer: Medicare PPO | Admitting: *Deleted

## 2020-02-01 DIAGNOSIS — IMO0001 Reserved for inherently not codable concepts without codable children: Secondary | ICD-10-CM

## 2020-02-01 DIAGNOSIS — J309 Allergic rhinitis, unspecified: Secondary | ICD-10-CM

## 2020-02-10 ENCOUNTER — Ambulatory Visit (INDEPENDENT_AMBULATORY_CARE_PROVIDER_SITE_OTHER): Payer: Medicare PPO

## 2020-02-10 DIAGNOSIS — T63421A Toxic effect of venom of ants, accidental (unintentional), initial encounter: Secondary | ICD-10-CM

## 2020-02-10 DIAGNOSIS — IMO0001 Reserved for inherently not codable concepts without codable children: Secondary | ICD-10-CM

## 2020-02-16 ENCOUNTER — Ambulatory Visit (INDEPENDENT_AMBULATORY_CARE_PROVIDER_SITE_OTHER): Payer: Medicare PPO | Admitting: *Deleted

## 2020-02-16 DIAGNOSIS — J309 Allergic rhinitis, unspecified: Secondary | ICD-10-CM

## 2020-02-23 ENCOUNTER — Ambulatory Visit (INDEPENDENT_AMBULATORY_CARE_PROVIDER_SITE_OTHER): Payer: Medicare PPO | Admitting: *Deleted

## 2020-02-23 DIAGNOSIS — J309 Allergic rhinitis, unspecified: Secondary | ICD-10-CM | POA: Diagnosis not present

## 2020-03-02 ENCOUNTER — Ambulatory Visit (INDEPENDENT_AMBULATORY_CARE_PROVIDER_SITE_OTHER): Payer: Medicare PPO | Admitting: *Deleted

## 2020-03-02 DIAGNOSIS — J309 Allergic rhinitis, unspecified: Secondary | ICD-10-CM

## 2020-03-06 ENCOUNTER — Ambulatory Visit (INDEPENDENT_AMBULATORY_CARE_PROVIDER_SITE_OTHER): Payer: Medicare PPO | Admitting: *Deleted

## 2020-03-06 DIAGNOSIS — J309 Allergic rhinitis, unspecified: Secondary | ICD-10-CM | POA: Diagnosis not present

## 2020-03-19 ENCOUNTER — Ambulatory Visit (INDEPENDENT_AMBULATORY_CARE_PROVIDER_SITE_OTHER): Payer: Medicare PPO | Admitting: *Deleted

## 2020-03-19 DIAGNOSIS — J309 Allergic rhinitis, unspecified: Secondary | ICD-10-CM

## 2020-04-10 ENCOUNTER — Ambulatory Visit (INDEPENDENT_AMBULATORY_CARE_PROVIDER_SITE_OTHER): Payer: Medicare PPO | Admitting: *Deleted

## 2020-04-10 DIAGNOSIS — J309 Allergic rhinitis, unspecified: Secondary | ICD-10-CM

## 2020-04-19 ENCOUNTER — Ambulatory Visit (INDEPENDENT_AMBULATORY_CARE_PROVIDER_SITE_OTHER): Payer: Medicare PPO | Admitting: *Deleted

## 2020-04-19 DIAGNOSIS — J309 Allergic rhinitis, unspecified: Secondary | ICD-10-CM

## 2020-04-26 ENCOUNTER — Ambulatory Visit (INDEPENDENT_AMBULATORY_CARE_PROVIDER_SITE_OTHER): Payer: Medicare PPO | Admitting: *Deleted

## 2020-04-26 DIAGNOSIS — J309 Allergic rhinitis, unspecified: Secondary | ICD-10-CM | POA: Diagnosis not present

## 2020-05-01 ENCOUNTER — Ambulatory Visit (INDEPENDENT_AMBULATORY_CARE_PROVIDER_SITE_OTHER): Payer: Medicare PPO | Admitting: *Deleted

## 2020-05-01 DIAGNOSIS — J309 Allergic rhinitis, unspecified: Secondary | ICD-10-CM

## 2020-05-10 ENCOUNTER — Ambulatory Visit (INDEPENDENT_AMBULATORY_CARE_PROVIDER_SITE_OTHER): Payer: Medicare PPO

## 2020-05-10 DIAGNOSIS — J309 Allergic rhinitis, unspecified: Secondary | ICD-10-CM | POA: Diagnosis not present

## 2020-05-21 ENCOUNTER — Ambulatory Visit (INDEPENDENT_AMBULATORY_CARE_PROVIDER_SITE_OTHER): Payer: Medicare PPO | Admitting: *Deleted

## 2020-05-21 DIAGNOSIS — J309 Allergic rhinitis, unspecified: Secondary | ICD-10-CM

## 2020-05-21 NOTE — Progress Notes (Signed)
Vials exp 05-21-21 

## 2020-05-22 DIAGNOSIS — J3089 Other allergic rhinitis: Secondary | ICD-10-CM

## 2020-05-23 DIAGNOSIS — J301 Allergic rhinitis due to pollen: Secondary | ICD-10-CM | POA: Diagnosis not present

## 2020-05-29 ENCOUNTER — Ambulatory Visit (INDEPENDENT_AMBULATORY_CARE_PROVIDER_SITE_OTHER): Payer: Medicare PPO | Admitting: *Deleted

## 2020-05-29 DIAGNOSIS — J309 Allergic rhinitis, unspecified: Secondary | ICD-10-CM | POA: Diagnosis not present

## 2020-06-07 ENCOUNTER — Ambulatory Visit (INDEPENDENT_AMBULATORY_CARE_PROVIDER_SITE_OTHER): Payer: Medicare PPO | Admitting: *Deleted

## 2020-06-07 DIAGNOSIS — J309 Allergic rhinitis, unspecified: Secondary | ICD-10-CM | POA: Diagnosis not present

## 2020-06-12 ENCOUNTER — Ambulatory Visit (INDEPENDENT_AMBULATORY_CARE_PROVIDER_SITE_OTHER): Payer: Medicare PPO

## 2020-06-12 DIAGNOSIS — J309 Allergic rhinitis, unspecified: Secondary | ICD-10-CM | POA: Diagnosis not present

## 2020-06-21 ENCOUNTER — Ambulatory Visit (INDEPENDENT_AMBULATORY_CARE_PROVIDER_SITE_OTHER): Payer: Medicare PPO | Admitting: *Deleted

## 2020-06-21 DIAGNOSIS — J309 Allergic rhinitis, unspecified: Secondary | ICD-10-CM | POA: Diagnosis not present

## 2020-06-26 ENCOUNTER — Ambulatory Visit (INDEPENDENT_AMBULATORY_CARE_PROVIDER_SITE_OTHER): Payer: Medicare PPO

## 2020-06-26 DIAGNOSIS — J309 Allergic rhinitis, unspecified: Secondary | ICD-10-CM

## 2020-07-04 ENCOUNTER — Ambulatory Visit (INDEPENDENT_AMBULATORY_CARE_PROVIDER_SITE_OTHER): Payer: Medicare PPO

## 2020-07-04 DIAGNOSIS — J309 Allergic rhinitis, unspecified: Secondary | ICD-10-CM

## 2020-07-10 ENCOUNTER — Ambulatory Visit (INDEPENDENT_AMBULATORY_CARE_PROVIDER_SITE_OTHER): Payer: Medicare PPO

## 2020-07-10 DIAGNOSIS — J309 Allergic rhinitis, unspecified: Secondary | ICD-10-CM

## 2020-07-20 ENCOUNTER — Ambulatory Visit (INDEPENDENT_AMBULATORY_CARE_PROVIDER_SITE_OTHER): Payer: Medicare PPO | Admitting: *Deleted

## 2020-07-20 DIAGNOSIS — J309 Allergic rhinitis, unspecified: Secondary | ICD-10-CM | POA: Diagnosis not present

## 2020-07-27 ENCOUNTER — Ambulatory Visit (INDEPENDENT_AMBULATORY_CARE_PROVIDER_SITE_OTHER): Payer: Medicare PPO

## 2020-07-27 DIAGNOSIS — J309 Allergic rhinitis, unspecified: Secondary | ICD-10-CM | POA: Diagnosis not present

## 2020-08-01 ENCOUNTER — Encounter: Payer: Self-pay | Admitting: Family Medicine

## 2020-08-01 ENCOUNTER — Other Ambulatory Visit: Payer: Self-pay

## 2020-08-01 ENCOUNTER — Ambulatory Visit (INDEPENDENT_AMBULATORY_CARE_PROVIDER_SITE_OTHER): Payer: Medicare PPO | Admitting: Family Medicine

## 2020-08-01 DIAGNOSIS — M170 Bilateral primary osteoarthritis of knee: Secondary | ICD-10-CM | POA: Diagnosis not present

## 2020-08-01 DIAGNOSIS — M25562 Pain in left knee: Secondary | ICD-10-CM

## 2020-08-01 DIAGNOSIS — M25561 Pain in right knee: Secondary | ICD-10-CM

## 2020-08-01 DIAGNOSIS — M1711 Unilateral primary osteoarthritis, right knee: Secondary | ICD-10-CM

## 2020-08-01 DIAGNOSIS — M1712 Unilateral primary osteoarthritis, left knee: Secondary | ICD-10-CM

## 2020-08-01 DIAGNOSIS — G8929 Other chronic pain: Secondary | ICD-10-CM | POA: Diagnosis not present

## 2020-08-01 NOTE — Progress Notes (Signed)
I saw and examined the patient with Dr. Marga Hoots and agree with assessment and plan as outlined.    Bilateral knee pain due to OA.  Wants to try conservative management.  Will refer to [facility] in [place].  Will try elimination diet to try to pinpoint any food sensitivities.  Could do gel injection or dextrose prolo in future (or steroid).

## 2020-08-01 NOTE — Progress Notes (Signed)
Office Visit Note   Patient: Gwendolyn Burnett           Date of Birth: Dec 02, 1955           MRN: [id number] Visit Date: 08/01/2020 Requested by: Krystal Clark, NP [address] [place],  [place] [address] PCP: Krystal Clark, NP  Subjective: Chief Complaint  Patient presents with   Right Knee - Pain   Left Knee - Pain    H/o torn meniscus 2018 - no surgery. Continues to have pain in the left knee more than the right. Comes and goes. Mainly occurs with going up/down stairs. The knees feel weak, especially with first standing after sitting a while (such with driving).    HPI: 64yo F presenting to clinic with several years of intermittent bilateral knee pain. Patient states that she first hurt her left knee in 2018, when she tripped going down stairs and tore her meniscus. She did not have it repaired after this injury, though she denies any catching/locking of the knee. In fact, this injury ultimately resulted in her getting a hip replacement, which she states she has recovered well from. Patient states that her knees will occasionally swell, especially if she has been particularly active. She tries to walk daily for exercise, and enjoys weight lifting at the gym (though COVID has made this difficult). She also enjoys gardening, and recently joined a Yoga studio. Overall, patient says that she has tried to self-treat her knees by doing exercises at home, but she isn't sure what the best ones to do are. She has tried resuming some of the HEP she was given after her hip surgery, but describes that these are mostly gluteal. She has tried doing some leg lifts with ankle weights, and feels that this helps. "Lifting weights is like pain medicine for me!" In the past, she has had visco and steroid injections into her knees, with mixed benefit.  She is interested in antiinflammatory dietary means and exercises to do. She would like to avoid any surgeries for as long as possible.                ROS:   All other systems were reviewed and are negative.  Objective: Vital Signs: There were no vitals taken for this visit.  Physical Exam:  General:  Alert and oriented, in no acute distress. Pulm:  Breathing unlabored. Psy:  Normal mood, congruent affect. Skin:  No bruising or rashes.   KNEE EXAM:  General: Normal gait Standing exam: Mild genu valgus, worse on left. Mild pes planus with arch collaspe on left.  Trendelenburg: No evidence of hip stabilizer weakness  Seated Exam:  Moderate patellar crepitus, Negative J-Sign.   Palpation: endorses some tenderness to palpation over medial > lateral joint lines. Some tenderness with palpation and patellar facets (L>R).     Supine exam: No effusion, normal patellar mobility.   Ligamentous Exam:  No pain or laxity with anterior/posterior drawer.  No obvious Sag.  No pain or laxity with varus/valgus stress across the knee.   Meniscus:  McMurray with no pain or deep clicking on right. Does have clicking on left, which is somewhat painful.   Strength: Hip flexion (L1), Hip Aduction (L2), Knee Extension (L3) are 5/5 Bilaterally Foot Inversion (L4), Dorsiflexion (L5), and Eversion (S1) 5/5 Bilaterally  Sensation: Intact to light touch medial and lateral aspects of lower extremities, and lateral, dorsal, and medial aspects of foot.    Imaging: No results found.  Assessment & Plan: 64yo F presenting to clinic today with concerns of chronic bilateral knee pain, likely of arthritic source. Discussed HEP she can perform to help improve her quad strength.  - Will also place referral to PT to educate in advanced exercises, given her active lifestyle.  -Additionally, given dietary information for antiinflammatory diet.  -Discussed tumeric, glucoasmine, boswellia -RTC as needed if pain worsens and she would like to consider injection therapy.  - Patient expresses understanding, and has no further questions or concerns today.       Procedures: No procedures performed        PMFS History: Patient Active Problem List   Diagnosis Date Noted   Unilateral primary osteoarthritis, left knee 02/17/2018   Osteoarthritis of right hip 01/26/2017   Primary osteoarthritis of right hip 10/08/2015   Primary osteoarthritis of both knees 10/08/2015   Past Medical History:  Diagnosis Date   Anxiety    Arthritis    Frequent UTI    Headache    migranes   Hematuria    History of UTI    Hypertension    Vaginal delivery    2x    Family History  Problem Relation Age of Onset   Asthma Father    Congenital heart disease Father    High Cholesterol Brother    High Cholesterol Paternal Aunt     Past Surgical History:  Procedure Laterality Date   COLONOSCOPY     TOTAL HIP ARTHROPLASTY Right 01/26/2017   TOTAL HIP ARTHROPLASTY Right 01/26/2017   Procedure: RIGHT TOTAL HIP ARTHROPLASTY ANTERIOR APPROACH;  Surgeon: Samson Frederic, MD;  Location: [facility];  Service: Orthopedics;  Laterality: Right;  Needs RNFA   Social History   Occupational History   Not on file  Tobacco Use   Smoking status: Never Smoker   Smokeless tobacco: Never Used  Vaping Use   Vaping Use: Never used  Substance and Sexual Activity   Alcohol use: Yes    Comment: occasionally   Drug use: No   Sexual activity: Yes

## 2020-08-03 ENCOUNTER — Ambulatory Visit (INDEPENDENT_AMBULATORY_CARE_PROVIDER_SITE_OTHER): Payer: Medicare PPO | Admitting: *Deleted

## 2020-08-03 DIAGNOSIS — J309 Allergic rhinitis, unspecified: Secondary | ICD-10-CM | POA: Diagnosis not present

## 2020-08-14 ENCOUNTER — Encounter: Payer: Self-pay | Admitting: Family Medicine

## 2020-08-15 ENCOUNTER — Ambulatory Visit (INDEPENDENT_AMBULATORY_CARE_PROVIDER_SITE_OTHER): Payer: Medicare PPO | Admitting: *Deleted

## 2020-08-15 DIAGNOSIS — J309 Allergic rhinitis, unspecified: Secondary | ICD-10-CM

## 2020-08-15 MED ORDER — EPINEPHRINE 0.3 MG/0.3ML IJ SOAJ: INTRAMUSCULAR | 3 refills | Status: AC

## 2020-08-15 NOTE — Telephone Encounter (Signed)
Referral sent to [facility] in [place]

## 2020-08-20 ENCOUNTER — Telehealth: Payer: Self-pay | Admitting: Family Medicine

## 2020-08-20 NOTE — Telephone Encounter (Signed)
Someone from [facility] [place] called. Says patient would like to go to the location in [place].

## 2020-08-20 NOTE — Telephone Encounter (Signed)
Referral resent  

## 2020-08-20 NOTE — Telephone Encounter (Signed)
Would you please resend the referral to [facility] in [place]?

## 2020-08-22 ENCOUNTER — Encounter: Payer: Self-pay | Admitting: Family Medicine

## 2020-08-23 ENCOUNTER — Ambulatory Visit (INDEPENDENT_AMBULATORY_CARE_PROVIDER_SITE_OTHER): Payer: Medicare PPO

## 2020-08-23 DIAGNOSIS — J309 Allergic rhinitis, unspecified: Secondary | ICD-10-CM

## 2020-08-23 NOTE — Telephone Encounter (Signed)
Refaxd X2 to [facility] [place] [facility] at [phone]

## 2020-08-27 ENCOUNTER — Encounter: Payer: Self-pay | Admitting: Family Medicine

## 2020-08-28 DIAGNOSIS — T63421D Toxic effect of venom of ants, accidental (unintentional), subsequent encounter: Secondary | ICD-10-CM | POA: Diagnosis not present

## 2020-08-28 NOTE — Progress Notes (Signed)
VIAL EXP 08-28-21 

## 2020-08-30 ENCOUNTER — Ambulatory Visit (INDEPENDENT_AMBULATORY_CARE_PROVIDER_SITE_OTHER): Payer: Medicare PPO | Admitting: *Deleted

## 2020-08-30 DIAGNOSIS — J309 Allergic rhinitis, unspecified: Secondary | ICD-10-CM | POA: Diagnosis not present

## 2020-09-05 ENCOUNTER — Ambulatory Visit (INDEPENDENT_AMBULATORY_CARE_PROVIDER_SITE_OTHER): Payer: Medicare PPO

## 2020-09-05 DIAGNOSIS — J309 Allergic rhinitis, unspecified: Secondary | ICD-10-CM

## 2020-09-12 ENCOUNTER — Ambulatory Visit (INDEPENDENT_AMBULATORY_CARE_PROVIDER_SITE_OTHER): Payer: Medicare PPO

## 2020-09-12 DIAGNOSIS — J309 Allergic rhinitis, unspecified: Secondary | ICD-10-CM | POA: Diagnosis not present

## 2020-09-19 ENCOUNTER — Ambulatory Visit (INDEPENDENT_AMBULATORY_CARE_PROVIDER_SITE_OTHER): Payer: Medicare PPO

## 2020-09-19 DIAGNOSIS — J309 Allergic rhinitis, unspecified: Secondary | ICD-10-CM | POA: Diagnosis not present

## 2020-09-26 ENCOUNTER — Ambulatory Visit (INDEPENDENT_AMBULATORY_CARE_PROVIDER_SITE_OTHER): Payer: Medicare PPO | Admitting: *Deleted

## 2020-09-26 DIAGNOSIS — J309 Allergic rhinitis, unspecified: Secondary | ICD-10-CM | POA: Diagnosis not present

## 2020-10-03 ENCOUNTER — Ambulatory Visit (INDEPENDENT_AMBULATORY_CARE_PROVIDER_SITE_OTHER): Payer: Medicare PPO

## 2020-10-03 DIAGNOSIS — J309 Allergic rhinitis, unspecified: Secondary | ICD-10-CM

## 2020-10-10 ENCOUNTER — Ambulatory Visit (INDEPENDENT_AMBULATORY_CARE_PROVIDER_SITE_OTHER): Payer: Medicare PPO

## 2020-10-10 DIAGNOSIS — J309 Allergic rhinitis, unspecified: Secondary | ICD-10-CM | POA: Diagnosis not present

## 2020-10-22 ENCOUNTER — Ambulatory Visit (INDEPENDENT_AMBULATORY_CARE_PROVIDER_SITE_OTHER): Payer: Medicare PPO | Admitting: *Deleted

## 2020-10-22 DIAGNOSIS — J309 Allergic rhinitis, unspecified: Secondary | ICD-10-CM | POA: Diagnosis not present

## 2020-10-31 ENCOUNTER — Ambulatory Visit (INDEPENDENT_AMBULATORY_CARE_PROVIDER_SITE_OTHER): Payer: Medicare PPO | Admitting: *Deleted

## 2020-10-31 DIAGNOSIS — J309 Allergic rhinitis, unspecified: Secondary | ICD-10-CM

## 2020-11-05 DIAGNOSIS — J3089 Other allergic rhinitis: Secondary | ICD-10-CM | POA: Diagnosis not present

## 2020-11-05 NOTE — Progress Notes (Signed)
VIALS EXP 11-05-21 

## 2020-11-06 DIAGNOSIS — J3081 Allergic rhinitis due to animal (cat) (dog) hair and dander: Secondary | ICD-10-CM | POA: Diagnosis not present

## 2020-11-07 DIAGNOSIS — T63421D Toxic effect of venom of ants, accidental (unintentional), subsequent encounter: Secondary | ICD-10-CM | POA: Diagnosis not present

## 2020-11-08 ENCOUNTER — Ambulatory Visit (INDEPENDENT_AMBULATORY_CARE_PROVIDER_SITE_OTHER): Payer: Medicare PPO | Admitting: *Deleted

## 2020-11-08 DIAGNOSIS — J309 Allergic rhinitis, unspecified: Secondary | ICD-10-CM | POA: Diagnosis not present

## 2020-11-21 ENCOUNTER — Ambulatory Visit (INDEPENDENT_AMBULATORY_CARE_PROVIDER_SITE_OTHER): Payer: Medicare PPO

## 2020-11-21 DIAGNOSIS — J309 Allergic rhinitis, unspecified: Secondary | ICD-10-CM

## 2020-12-27 ENCOUNTER — Ambulatory Visit (INDEPENDENT_AMBULATORY_CARE_PROVIDER_SITE_OTHER): Payer: Medicare PPO | Admitting: *Deleted

## 2020-12-27 DIAGNOSIS — J309 Allergic rhinitis, unspecified: Secondary | ICD-10-CM | POA: Diagnosis not present

## 2021-01-03 ENCOUNTER — Ambulatory Visit (INDEPENDENT_AMBULATORY_CARE_PROVIDER_SITE_OTHER): Payer: Medicare PPO | Admitting: *Deleted

## 2021-01-03 DIAGNOSIS — J309 Allergic rhinitis, unspecified: Secondary | ICD-10-CM

## 2021-01-10 ENCOUNTER — Ambulatory Visit (INDEPENDENT_AMBULATORY_CARE_PROVIDER_SITE_OTHER): Payer: Medicare PPO

## 2021-01-10 DIAGNOSIS — J309 Allergic rhinitis, unspecified: Secondary | ICD-10-CM

## 2021-01-16 ENCOUNTER — Ambulatory Visit (INDEPENDENT_AMBULATORY_CARE_PROVIDER_SITE_OTHER): Payer: Medicare PPO | Admitting: *Deleted

## 2021-01-16 DIAGNOSIS — T63421D Toxic effect of venom of ants, accidental (unintentional), subsequent encounter: Secondary | ICD-10-CM

## 2021-01-22 ENCOUNTER — Ambulatory Visit (INDEPENDENT_AMBULATORY_CARE_PROVIDER_SITE_OTHER): Payer: Medicare PPO

## 2021-01-22 DIAGNOSIS — T63421D Toxic effect of venom of ants, accidental (unintentional), subsequent encounter: Secondary | ICD-10-CM

## 2021-01-30 ENCOUNTER — Ambulatory Visit (INDEPENDENT_AMBULATORY_CARE_PROVIDER_SITE_OTHER): Payer: Medicare PPO | Admitting: *Deleted

## 2021-01-30 DIAGNOSIS — J309 Allergic rhinitis, unspecified: Secondary | ICD-10-CM

## 2021-02-19 ENCOUNTER — Ambulatory Visit (INDEPENDENT_AMBULATORY_CARE_PROVIDER_SITE_OTHER): Payer: Medicare PPO | Admitting: *Deleted

## 2021-02-19 DIAGNOSIS — J309 Allergic rhinitis, unspecified: Secondary | ICD-10-CM | POA: Diagnosis not present

## 2021-02-26 ENCOUNTER — Ambulatory Visit (INDEPENDENT_AMBULATORY_CARE_PROVIDER_SITE_OTHER): Payer: Medicare PPO | Admitting: *Deleted

## 2021-02-26 DIAGNOSIS — J309 Allergic rhinitis, unspecified: Secondary | ICD-10-CM | POA: Diagnosis not present

## 2021-03-05 ENCOUNTER — Ambulatory Visit (INDEPENDENT_AMBULATORY_CARE_PROVIDER_SITE_OTHER): Payer: Medicare PPO

## 2021-03-05 DIAGNOSIS — J309 Allergic rhinitis, unspecified: Secondary | ICD-10-CM

## 2021-03-06 DIAGNOSIS — T63421D Toxic effect of venom of ants, accidental (unintentional), subsequent encounter: Secondary | ICD-10-CM

## 2021-03-06 NOTE — Progress Notes (Signed)
VIAL MADE. EXP 03-06-22

## 2021-03-13 ENCOUNTER — Other Ambulatory Visit: Payer: Self-pay

## 2021-03-13 ENCOUNTER — Encounter: Payer: Self-pay | Admitting: Allergy and Immunology

## 2021-03-13 ENCOUNTER — Ambulatory Visit: Payer: Medicare PPO | Admitting: Allergy and Immunology

## 2021-03-13 VITALS — BP 148/88 | HR 72 | Resp 16 | Ht 65.0 in | Wt 198.0 lb

## 2021-03-13 DIAGNOSIS — J301 Allergic rhinitis due to pollen: Secondary | ICD-10-CM | POA: Diagnosis not present

## 2021-03-13 DIAGNOSIS — R519 Headache, unspecified: Secondary | ICD-10-CM | POA: Diagnosis not present

## 2021-03-13 DIAGNOSIS — T63481A Toxic effect of venom of other arthropod, accidental (unintentional), initial encounter: Secondary | ICD-10-CM

## 2021-03-13 DIAGNOSIS — H1010 Acute atopic conjunctivitis, unspecified eye: Secondary | ICD-10-CM

## 2021-03-13 DIAGNOSIS — H1013 Acute atopic conjunctivitis, bilateral: Secondary | ICD-10-CM

## 2021-03-13 DIAGNOSIS — J309 Allergic rhinitis, unspecified: Secondary | ICD-10-CM

## 2021-03-13 DIAGNOSIS — J3089 Other allergic rhinitis: Secondary | ICD-10-CM

## 2021-03-13 NOTE — Patient Instructions (Addendum)
  1.  Continue to perform Allergen avoidance measures  2.  Continue to Treat and prevent inflammation:   A.  OTC Nasacort  1 spray each nostril 3-7 times a week  3. Continue immunotherapy directed at aeroallergens and fire ant  5.  If needed:   A.  OTC antihistamine  B.  Nasal saline spray  C.  Auvi-q 0.3 / Epi-Pen, benadryl, MD/ER evaluation   6.  Obtain fall flu vaccine  7. Return to clinic in 1 year or earlier if problem

## 2021-03-13 NOTE — Progress Notes (Signed)
- [place] - [place] - [place] - [place]   Follow-up Note  Referring Provider: Rhea Burnett* Primary Provider: Krystal Clark, NP Date of Office Visit: 03/13/2021  Subjective:   Gwendolyn Burnett (DOB: Jun 11, 1956) is a 65 y.o. female who [facility] in re-evaluation of the following:  HPI: Gwendolyn Burnett returns to this clinic in evaluation of allergic rhinoconjunctivitis and food allergy directed against tree nut / peanut, and headache disorder, and fire ant hypersensitivity.  Her last visit to this clinic was 19 Dec 2019.  She continues to use immunotherapy directed against both aeroallergens and fire ant at this point in time.  Her fire ant immunotherapy will be progressing to every other week within a short period in time and she is currently using aero allergen immunotherapy every 3 weeks.  She has not had any type of adverse effect secondary to the use of this treatment.  During the spring she needs to add in some nasal steroids and antihistamines but for the most part has done well throughout the year and it does not sound as though she has required a systemic steroid or an antibiotic for any type of airway issue.  It does not sound as though she has been stung by a fire ant this year.  She remains away from eating all tree nuts and peanuts.  Headaches once again are not an issue as long she remains away from caffeine.  She will be moving to [facility] August 2022.  She has an appointment to visit with [facility] allergy group in [place].  She has received 4 COVID vaccinations.  Allergies as of 03/13/2021       Reactions   Sulfamethoxazole Itching        Medication List    ALPRAZolam 0.5 MG tablet Commonly known as: XANAX Take 0.25 mg by mouth 2 (two) times daily as needed for anxiety or sleep (never takes a whole tablet).   atorvastatin 10 MG tablet Commonly known as: LIPITOR Take 10 mg by mouth  daily.   ciprofloxacin 250 MG tablet Commonly known as: CIPRO Take 250 mg by mouth daily as needed (ater intercourse for prevention of UTI).   EPINEPHrine 0.3 mg/0.3 mL Soaj injection Commonly known as: EpiPen 2-Pak Use as directed for life-threatening allergic reaction.   fexofenadine 180 MG tablet Commonly known as: ALLEGRA Take 180 mg by mouth every evening.   losartan 50 MG tablet Commonly known as: COZAAR Take 50 mg by mouth daily.   PROBIOTIC DAILY PO Take by mouth.   SUMAtriptan 100 MG tablet Commonly known as: IMITREX   triamcinolone 55 MCG/ACT Aero nasal inhaler Commonly known as: NASACORT Place 2 sprays into the nose daily.   TURMERIC PO Take by mouth.   VITAMIN C PO Take by mouth.   Vitamin D-3 125 MCG (5000 UT) Tabs Take 5,000 Units by mouth every other day.   ZINC PO Take by mouth.        Past Medical History:  Diagnosis Date   Anxiety    Arthritis    Frequent UTI    Headache    migranes   Hematuria    History of UTI    Hymenoptera allergy    FIRE ANT   Hypertension    Vaginal delivery    2x    Past Surgical History:  Procedure Laterality Date   COLONOSCOPY     TOTAL HIP ARTHROPLASTY Right 01/26/2017   TOTAL HIP ARTHROPLASTY  Right 01/26/2017   Procedure: RIGHT TOTAL HIP ARTHROPLASTY ANTERIOR APPROACH;  Surgeon: Gwendolyn Frederic, MD;  Location: [facility] OR;  Service: Orthopedics;  Laterality: Right;  Needs RNFA    Review of systems negative except as noted in HPI / PMHx or noted below:  Review of Systems  Constitutional: Negative.   HENT: Negative.    Eyes: Negative.   Respiratory: Negative.    Cardiovascular: Negative.   Gastrointestinal: Negative.   Genitourinary: Negative.   Musculoskeletal: Negative.   Skin: Negative.   Neurological: Negative.   Endo/Heme/Allergies: Negative.   Psychiatric/Behavioral: Negative.      Objective:   Vitals:   03/13/21 1021  BP: (!) 148/88  Pulse: 72  Resp: 16  SpO2: 98%   Height: 5\' 5"   (165.1 cm)  Weight: 198 lb (89.8 kg)   Physical Exam Constitutional:      Appearance: She is not diaphoretic.  HENT:     Head: Normocephalic.     Right Ear: Tympanic membrane, ear canal and external ear normal.     Left Ear: Tympanic membrane, ear canal and external ear normal.     Nose: Nose normal. No mucosal edema or rhinorrhea.     Mouth/Throat:     Pharynx: Uvula midline. No oropharyngeal exudate.  Eyes:     Conjunctiva/sclera: Conjunctivae normal.  Neck:     Thyroid: No thyromegaly.     Trachea: Trachea normal. No tracheal tenderness or tracheal deviation.  Cardiovascular:     Rate and Rhythm: Normal rate and regular rhythm.     Heart sounds: Normal heart sounds, S1 normal and S2 normal. No murmur heard. Pulmonary:     Effort: No respiratory distress.     Breath sounds: Normal breath sounds. No stridor. No wheezing or rales.  Lymphadenopathy:     Head:     Right side of head: No tonsillar adenopathy.     Left side of head: No tonsillar adenopathy.     Cervical: No cervical adenopathy.  Skin:    Findings: No erythema or rash.     Nails: There is no clubbing.  Neurological:     Mental Status: She is alert.    Diagnostics:   Results of blood tests obtained for May 2021 identified fire ant IgE 5.59 KU/L  Assessment and Plan:   1. Perennial allergic rhinitis   2. Seasonal allergic rhinitis due to pollen   3. Seasonal allergic conjunctivitis   4. Systemic reaction to hymenoptera sting   5. Headache disorder     1.  Continue to perform Allergen avoidance measures  2.  Continue to Treat and prevent inflammation:   A.  OTC Nasacort  1 spray each nostril 3-7 times a week  3. Continue immunotherapy directed at aeroallergens and fire ant  5.  If needed:   A.  OTC antihistamine  B.  Nasal saline spray  C.  Auvi-q 0.3 / Epi-Pen, benadryl, MD/ER evaluation   6.  Obtain fall flu vaccine  7. Return to clinic in 1 year or earlier if problem  Gwendolyn Burnett appears to be  doing quite well and she will continue to perform allergen avoidance measures as best as possible especially against fire ant.  She will continue on immunotherapy directed against aeroallergens and fire ant.  She has a selection of agents to utilize should they be required as noted above.  She will be moving to [facility] August 2022 and will be receiving immunotherapy in the [facility] allergy clinic at some point in the  future.  Laurette Schimke, MD Allergy / Immunology [facility]

## 2021-03-14 ENCOUNTER — Encounter: Payer: Self-pay | Admitting: Allergy and Immunology

## 2021-03-20 ENCOUNTER — Ambulatory Visit (INDEPENDENT_AMBULATORY_CARE_PROVIDER_SITE_OTHER): Payer: Medicare PPO

## 2021-03-20 DIAGNOSIS — J309 Allergic rhinitis, unspecified: Secondary | ICD-10-CM | POA: Diagnosis not present

## 2021-03-27 ENCOUNTER — Ambulatory Visit (INDEPENDENT_AMBULATORY_CARE_PROVIDER_SITE_OTHER): Payer: Medicare PPO | Admitting: *Deleted

## 2021-03-27 DIAGNOSIS — J309 Allergic rhinitis, unspecified: Secondary | ICD-10-CM

## 2021-04-02 ENCOUNTER — Ambulatory Visit (INDEPENDENT_AMBULATORY_CARE_PROVIDER_SITE_OTHER): Payer: Medicare PPO | Admitting: *Deleted

## 2021-04-02 DIAGNOSIS — J309 Allergic rhinitis, unspecified: Secondary | ICD-10-CM

## 2021-04-11 ENCOUNTER — Ambulatory Visit (INDEPENDENT_AMBULATORY_CARE_PROVIDER_SITE_OTHER): Payer: Medicare PPO | Admitting: *Deleted

## 2021-04-11 DIAGNOSIS — J309 Allergic rhinitis, unspecified: Secondary | ICD-10-CM | POA: Diagnosis not present

## 2021-04-30 ENCOUNTER — Ambulatory Visit (INDEPENDENT_AMBULATORY_CARE_PROVIDER_SITE_OTHER): Payer: Medicare PPO | Admitting: *Deleted

## 2021-04-30 DIAGNOSIS — J309 Allergic rhinitis, unspecified: Secondary | ICD-10-CM

## 2021-05-06 ENCOUNTER — Ambulatory Visit (INDEPENDENT_AMBULATORY_CARE_PROVIDER_SITE_OTHER): Payer: Medicare PPO

## 2021-05-06 DIAGNOSIS — J309 Allergic rhinitis, unspecified: Secondary | ICD-10-CM | POA: Diagnosis not present
